# Patient Record
Sex: Male | Born: 1980 | Race: Black or African American | Hispanic: No | Marital: Married | Smoking: Current every day smoker
Health system: Southern US, Community
[De-identification: ages and names within clinical notes are randomized; demographics above are authoritative.]

## PROBLEM LIST (undated history)

## (undated) ENCOUNTER — Emergency Department (HOSPITAL_BASED_OUTPATIENT_CLINIC_OR_DEPARTMENT_OTHER): Payer: BC Managed Care – PPO

## (undated) DIAGNOSIS — G43909 Migraine, unspecified, not intractable, without status migrainosus: Secondary | ICD-10-CM

## (undated) HISTORY — PX: DENTAL SURGERY: SHX609

## (undated) HISTORY — PX: FOOT SURGERY: SHX648

---

## 2000-03-10 ENCOUNTER — Encounter: Payer: Self-pay | Admitting: Emergency Medicine

## 2000-03-10 ENCOUNTER — Emergency Department (HOSPITAL_COMMUNITY): Admission: EM | Admit: 2000-03-10 | Discharge: 2000-03-11 | Payer: Self-pay | Admitting: Emergency Medicine

## 2000-03-11 ENCOUNTER — Encounter: Payer: Self-pay | Admitting: Emergency Medicine

## 2003-11-29 ENCOUNTER — Emergency Department (HOSPITAL_COMMUNITY): Admission: EM | Admit: 2003-11-29 | Discharge: 2003-11-30 | Payer: Self-pay | Admitting: Emergency Medicine

## 2004-07-10 ENCOUNTER — Emergency Department (HOSPITAL_COMMUNITY): Admission: EM | Admit: 2004-07-10 | Discharge: 2004-07-10 | Payer: Self-pay | Admitting: Emergency Medicine

## 2006-08-02 ENCOUNTER — Emergency Department (HOSPITAL_COMMUNITY): Admission: EM | Admit: 2006-08-02 | Discharge: 2006-08-02 | Payer: Self-pay | Admitting: Emergency Medicine

## 2006-08-19 ENCOUNTER — Emergency Department (HOSPITAL_COMMUNITY): Admission: EM | Admit: 2006-08-19 | Discharge: 2006-08-19 | Payer: Self-pay | Admitting: Emergency Medicine

## 2009-06-06 ENCOUNTER — Emergency Department (HOSPITAL_COMMUNITY): Admission: EM | Admit: 2009-06-06 | Discharge: 2009-06-06 | Payer: Self-pay | Admitting: Emergency Medicine

## 2009-07-22 ENCOUNTER — Emergency Department (HOSPITAL_COMMUNITY): Admission: EM | Admit: 2009-07-22 | Discharge: 2009-07-22 | Payer: Self-pay | Admitting: Emergency Medicine

## 2010-09-16 ENCOUNTER — Emergency Department (HOSPITAL_COMMUNITY)
Admission: EM | Admit: 2010-09-16 | Discharge: 2010-09-16 | Disposition: A | Discharge status: Home / Self Care | Payer: Self-pay | Attending: Emergency Medicine | Admitting: Emergency Medicine

## 2010-09-16 DIAGNOSIS — G44009 Cluster headache syndrome, unspecified, not intractable: Secondary | ICD-10-CM | POA: Insufficient documentation

## 2010-09-16 DIAGNOSIS — F17200 Nicotine dependence, unspecified, uncomplicated: Secondary | ICD-10-CM | POA: Insufficient documentation

## 2010-09-16 DIAGNOSIS — J3489 Other specified disorders of nose and nasal sinuses: Secondary | ICD-10-CM | POA: Insufficient documentation

## 2010-09-16 LAB — URINALYSIS, ROUTINE W REFLEX MICROSCOPIC
Bilirubin Urine: NEGATIVE
Glucose, UA: NEGATIVE mg/dL
Hgb urine dipstick: NEGATIVE
Ketones, ur: NEGATIVE mg/dL
Nitrite: NEGATIVE
Protein, ur: NEGATIVE mg/dL
Specific Gravity, Urine: 1.019 (ref 1.005–1.030)
Urobilinogen, UA: 1 mg/dL (ref 0.0–1.0)
pH: 6 (ref 5.0–8.0)

## 2010-09-16 LAB — URINE MICROSCOPIC-ADD ON

## 2011-09-01 ENCOUNTER — Emergency Department (HOSPITAL_COMMUNITY)
Admission: EM | Admit: 2011-09-01 | Discharge: 2011-09-01 | Disposition: A | Discharge status: Home / Self Care | Payer: Self-pay | Attending: Emergency Medicine | Admitting: Emergency Medicine

## 2011-09-01 ENCOUNTER — Encounter (HOSPITAL_COMMUNITY): Payer: Self-pay | Admitting: *Deleted

## 2011-09-01 ENCOUNTER — Other Ambulatory Visit (HOSPITAL_COMMUNITY): Payer: Self-pay | Admitting: Pharmacy Technician

## 2011-09-01 DIAGNOSIS — J069 Acute upper respiratory infection, unspecified: Secondary | ICD-10-CM | POA: Insufficient documentation

## 2011-09-01 DIAGNOSIS — J329 Chronic sinusitis, unspecified: Secondary | ICD-10-CM

## 2011-09-01 DIAGNOSIS — R110 Nausea: Secondary | ICD-10-CM | POA: Insufficient documentation

## 2011-09-01 MED ORDER — AZITHROMYCIN 250 MG PO TABS: 250.0000 mg | ORAL_TABLET | Freq: Every day | ORAL | Status: AC

## 2011-09-01 MED ORDER — OXYMETAZOLINE HCL 0.05 % NA SOLN
1.0000 | Freq: Once | NASAL | Status: AC
  Administered 2011-09-01: 1 via NASAL
  Filled 2011-09-01: qty 15

## 2011-09-01 MED ORDER — ONDANSETRON HCL 4 MG PO TABS: 4.0000 mg | ORAL_TABLET | Freq: Four times a day (QID) | ORAL | Status: AC

## 2011-09-01 NOTE — Discharge Instructions (Signed)
RECOMMEND AFRIN USE AS DIRECTED BUT FOR NO LONGER THAN 2-3 DAYS OR A REBOUND CONGESTION WILL OCCUR, MAKING SYMPTOMS WORSE. YOU CAN USE PLAIN SALINE NASAL SPRAY FOR SYMPTOMATIC RELIEF OF SINUS PRESSURE. CONTINUE TYLENOL OR ADVIL COLD AND SINUS AND TAKE ZITHROMAX AS DIRECTED. PUSH FLUIDS. ZOFRAN FOR NAUSEA IF NEEDED. (YOUR PRESCRIPTIONS HAVE BEEN E-PRESCRIBED AND SHOULD BE READY FOR PICK UP AT YOUR PHARMACY).  Sinusitis Sinuses are air pockets within the bones of your face. The growth of bacteria within a sinus leads to infection. The infection prevents the sinuses from draining. This infection is called sinusitis. SYMPTOMS  There will be different areas of pain depending on which sinuses have become infected.  The maxillary sinuses often produce pain beneath the eyes.   Frontal sinusitis may cause pain in the middle of the forehead and above the eyes.  Other problems (symptoms) include:  Toothaches.   Colored, pus-like (purulent) drainage from the nose.   Swelling, warmth, and tenderness over the sinus areas may be signs of infection.  TREATMENT  Sinusitis is most often determined by an exam.X-rays may be taken. If x-rays have been taken, make sure you obtain your results or find out how you are to obtain them. Your caregiver may give you medications (antibiotics). These are medications that will help kill the bacteria causing the infection. You may also be given a medication (decongestant) that helps to reduce sinus swelling.  HOME CARE INSTRUCTIONS   Only take over-the-counter or prescription medicines for pain, discomfort, or fever as directed by your caregiver.   Drink extra fluids. Fluids help thin the mucus so your sinuses can drain more easily.   Applying either moist heat or ice packs to the sinus areas may help relieve discomfort.   Use saline nasal sprays to help moisten your sinuses. The sprays can be found at your local drugstore.  SEEK IMMEDIATE MEDICAL CARE IF:  You have a  fever.   You have increasing pain, severe headaches, or toothache.   You have nausea, vomiting, or drowsiness.   You develop unusual swelling around the face or trouble seeing.  MAKE SURE YOU:   Understand these instructions.   Will watch your condition.   Will get help right away if you are not doing well or get worse.  Document Released: 06/02/2005 Document Revised: 05/22/2011 Document Reviewed: 12/30/2006 [facility] Patient Information 2012 [place], [place].

## 2011-09-01 NOTE — ED Notes (Addendum)
Pt in c/o cough, congestion, and nausea x2 weeks, c/o facial pain and pressure

## 2011-09-01 NOTE — ED Provider Notes (Signed)
History     [name]: [id number]  Arrival date & time 09/01/11  1334   First MD Initiated Contact with Patient 09/01/11 1522      Chief Complaint  Patient presents with  . URI  . Nausea    (Consider location/radiation/quality/duration/timing/severity/associated sxs/prior treatment) Patient is a 31 y.o. male presenting with URI and vomiting. The history is provided by the patient.  URI The primary symptoms include headaches, sore throat, cough, nausea and vomiting. Primary symptoms do not include fever, wheezing, abdominal pain or rash. The current episode started more than 1 week ago.  The headache is not associated with neck stiffness.  Symptoms associated with the illness include sinus pressure, congestion and rhinorrhea. The illness is not associated with chills. Associated symptoms comments: Complains of sinus pressure, nasal congestion, cough for one month. Job requires going in and out of doors, cold to warm. He feels this is contributing to symptoms. No know fever..  Emesis  This is a new problem. The current episode started 2 days ago. The problem has not changed since onset.There has been no fever. Associated symptoms include cough, headaches and URI. Pertinent negatives include no abdominal pain, no chills and no fever. Associated symptoms comments: He reports his diet has consisted of chicken wings for the past 2 weeks only. He complains of nausea with diarrhea after eating. No abdominal pain. No bloody emesis or diarrhea..    History reviewed. No pertinent past medical history.  History reviewed. No pertinent past surgical history.  History reviewed. No pertinent family history.  History  Substance Use Topics  . Smoking status: Not on file  . Smokeless tobacco: Not on file  . Alcohol Use: Not on file      Review of Systems  Constitutional: Negative for fever and chills.  HENT: Positive for congestion, sore throat, rhinorrhea and sinus pressure. Negative for neck  stiffness.   Respiratory: Positive for cough. Negative for shortness of breath and wheezing.   Cardiovascular: Negative.  Negative for chest pain.  Gastrointestinal: Positive for nausea and vomiting. Negative for abdominal pain.  Musculoskeletal: Negative.   Skin: Negative.  Negative for rash.  Neurological: Positive for headaches.    Allergies  Review of patient's allergies indicates no known allergies.  Home Medications  No current outpatient prescriptions on file.  BP 126/64  Pulse 80  Temp 99 F (37.2 C)  Resp 20  SpO2 98%  Physical Exam  Constitutional: He appears well-developed and well-nourished.  HENT:  Head: Normocephalic.  Right Ear: External ear normal.  Left Ear: External ear normal.  Nose: Mucosal edema present. Right sinus exhibits frontal sinus tenderness. Left sinus exhibits frontal sinus tenderness.  Mouth/Throat: Oropharynx is clear and moist.  Eyes: Conjunctivae are normal. Pupils are equal, round, and reactive to light.  Neck: Normal range of motion. Neck supple.  Cardiovascular: Normal rate and normal heart sounds.   No murmur heard. Pulmonary/Chest: Effort normal and breath sounds normal. He has no wheezes. He has no rales.  Abdominal: Soft. Bowel sounds are normal. He exhibits no distension. There is no tenderness.  Musculoskeletal: Normal range of motion.  Lymphadenopathy:    He has no cervical adenopathy.  Skin: Skin is warm and dry. No pallor.    ED Course  Procedures (including critical care time)  Labs Reviewed - No data to display No results found.   No diagnosis found.    MDM          Rodena Medin, PA-C 09/01/11 1540

## 2011-09-02 NOTE — ED Provider Notes (Signed)
Medical screening examination/treatment/procedure(s) were performed by non-physician practitioner and as supervising physician I was immediately available for consultation/collaboration. Vikram Tillett, MD, FACEP   Shamecka Hocutt L Leane Loring, MD 09/02/11 0053 

## 2012-03-11 ENCOUNTER — Emergency Department (HOSPITAL_COMMUNITY)
Admission: EM | Admit: 2012-03-11 | Discharge: 2012-03-12 | Disposition: A | Discharge status: Home / Self Care | Payer: Self-pay | Attending: Emergency Medicine | Admitting: Emergency Medicine

## 2012-03-11 ENCOUNTER — Encounter (HOSPITAL_COMMUNITY): Payer: Self-pay | Admitting: Emergency Medicine

## 2012-03-11 DIAGNOSIS — F17200 Nicotine dependence, unspecified, uncomplicated: Secondary | ICD-10-CM | POA: Insufficient documentation

## 2012-03-11 DIAGNOSIS — R51 Headache: Secondary | ICD-10-CM | POA: Insufficient documentation

## 2012-03-11 HISTORY — DX: Migraine, unspecified, not intractable, without status migrainosus: G43.909

## 2012-03-11 MED ORDER — METOCLOPRAMIDE HCL 5 MG/ML IJ SOLN
10.0000 mg | Freq: Once | INTRAMUSCULAR | Status: AC
  Administered 2012-03-11: 10 mg via INTRAVENOUS
  Filled 2012-03-11: qty 2

## 2012-03-11 MED ORDER — DIPHENHYDRAMINE HCL 50 MG/ML IJ SOLN
25.0000 mg | Freq: Once | INTRAMUSCULAR | Status: AC
  Administered 2012-03-11: 25 mg via INTRAVENOUS
  Filled 2012-03-11: qty 1

## 2012-03-11 MED ORDER — DEXAMETHASONE SODIUM PHOSPHATE 10 MG/ML IJ SOLN
10.0000 mg | Freq: Once | INTRAMUSCULAR | Status: AC
  Administered 2012-03-11: 10 mg via INTRAVENOUS
  Filled 2012-03-11: qty 1

## 2012-03-11 NOTE — ED Provider Notes (Signed)
History     [name]: [id number]  Arrival date & time 03/11/12  2032   First MD Initiated Contact with Patient 03/11/12 2240      Chief Complaint  Patient presents with  . Headache    (Consider location/radiation/quality/duration/timing/severity/associated sxs/prior treatment) Patient is a 31 y.o. male presenting with headaches. The history is provided by the patient.  Headache  This is a chronic problem. The current episode started more than 1 week ago. The problem occurs constantly. The problem has been gradually worsening. The headache is associated with loud noise and bright light. The pain is located in the left unilateral region. The quality of the pain is described as dull and throbbing. The pain is at a severity of 5/10. The pain does not radiate. Pertinent negatives include no anorexia, no fever, no malaise/fatigue, no chest pressure, no near-syncope, no orthopnea, no palpitations, no syncope, no shortness of breath, no nausea and no vomiting. He has tried acetaminophen, aspirin and NSAIDs for the symptoms. The treatment provided mild relief.   Multiple years hx of L sided throbbing HA. Worse over last couple days. 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  Denies trauma, numbness, visual disturbance, weakness, fevers, nausea, or vomiting. Not worse HA of his life.   Past Medical History  Diagnosis Date  . Migraine     Past Surgical History  Procedure Date  . Dental surgery     History reviewed. No pertinent family history.  History  Substance Use Topics  . Smoking status: Current Every Day Smoker -- 0.2 packs/day    Types: Cigarettes  . Smokeless tobacco: Not on file  . Alcohol Use: Yes     socially      Review of Systems  Constitutional: Negative for fever, malaise/fatigue, activity change and appetite change.  HENT: Negative for ear pain, congestion, sore throat, rhinorrhea, neck pain, neck stiffness and sinus pressure.   Eyes: Negative for pain and redness.    Respiratory: Negative for cough, chest tightness and shortness of breath.   Cardiovascular: Negative for chest pain, palpitations, orthopnea, syncope and near-syncope.  Gastrointestinal: Negative for nausea, vomiting, abdominal pain, diarrhea, abdominal distention and anorexia.  Genitourinary: Negative for dysuria, flank pain and difficulty urinating.  Musculoskeletal: Negative for back pain.  Skin: Negative for rash and wound.  Neurological: Positive for headaches. Negative for dizziness, light-headedness and numbness.  Hematological: Negative for adenopathy.  Psychiatric/Behavioral: Negative for behavioral problems, confusion and agitation.    Allergies  Review of patient's allergies indicates no known allergies.  Home Medications   Current Outpatient Rx  Name Route Sig Dispense Refill  . ACETAMINOPHEN 500 MG PO TABS Oral Take 500 mg by mouth every 6 (six) hours as needed.    . BC HEADACHE POWDER PO Oral Take 1 packet by mouth daily as needed. For pain per patient    . IBUPROFEN 200 MG PO TABS Oral Take 200 mg by mouth every 6 (six) hours as needed. For pain      BP 127/72  Pulse 70  Temp 98.4 F (36.9 C) (Oral)  Resp 18  SpO2 99%  Physical Exam  Constitutional: He is oriented to person, place, and time. He appears well-developed and well-nourished. No distress.  HENT:  Head: Normocephalic and atraumatic.  Nose: Nose normal.  Mouth/Throat: Oropharynx is clear and moist.  Eyes: Conjunctivae normal and EOM are normal. Pupils are equal, round, and reactive to light.  Neck: Normal range of motion. Neck supple. No tracheal deviation present.  Cardiovascular: Normal rate, regular rhythm, normal heart sounds and intact distal pulses.   Pulmonary/Chest: Effort normal and breath sounds normal. No respiratory distress. He has no rales.  Abdominal: Soft. Bowel sounds are normal. He exhibits no distension. There is no tenderness. There is no rebound and no guarding.  Musculoskeletal:  Normal range of motion. He exhibits no edema and no tenderness.  Neurological: He is alert and oriented to person, place, and time. He has normal strength. No cranial nerve deficit or sensory deficit. He displays a negative Romberg sign. GCS eye subscore is 4. GCS verbal subscore is 5. GCS motor subscore is 6.  Reflex Scores:      Tricep reflexes are 2+ on the right side and 2+ on the left side.      Patellar reflexes are 2+ on the right side and 2+ on the left side.      Achilles reflexes are 2+ on the right side and 2+ on the left side. Skin: Skin is warm and dry.  Psychiatric: He has a normal mood and affect. His behavior is normal.    ED Course  Procedures (including critical care time)  Labs Reviewed - No data to display No results found.   1. Headache       MDM  31 yo male in in no acute distress, afebrile, vital signs stable, non toxic appearing who presents with HA. Neuro exam non focal. Fundoscopic exam unremarkable. No fever , no meningismus - doubt meningitis. HA completely resolved with migraine cocktail (reglan, decadron, benadryl). Recommend Neurology out patient follow up given long hx of HAs. Thorough discussion with patient and family including return precautions patient expressed understanding.         Nadara Mustard, MD 03/12/12 [phone]

## 2012-03-11 NOTE — ED Notes (Addendum)
Pt reports for 3 weeks, having L side head throbbing intermittently; reports today having headache continuously today--reports gets worse at night; reports light/noise sensitivity; neuro intact; reports hx of migraines--stated that this is a monthly thing

## 2012-03-11 NOTE — ED Provider Notes (Addendum)
31 year old male has been having a le temporoparietal area. Headaches have not been getting worse nor have they been getting more frequent. Over-the-counter analgesics could give him only slight pain relief. On exam, funduscopic exam is normal. He does have tenderness over the left temporalis muscle and over the left TMJ. Neck is nontender and supple there is no tenderness palpation over the paracervical muscles. Neurologic exam is normal. His headache has aspects of that would be consistent with TMJ syndrome, fracture consistent with a muscle contraction headache. There also components which sound like possible cluster headache. This headache seems less likely given the length of time that this is been present without any abatement. 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 Headache started after having wisdom tooth extraction. He describes a throbbing pain primarily in the left  Dione Booze, MD 03/11/12 2352  I saw and evaluated the patient, reviewed the resident's note and I agree with the findings and plan.  Dione Booze, MD 03/12/12 [phone]

## 2012-03-19 ENCOUNTER — Encounter (HOSPITAL_COMMUNITY): Payer: Self-pay

## 2012-03-19 ENCOUNTER — Emergency Department (HOSPITAL_COMMUNITY)
Admission: EM | Admit: 2012-03-19 | Discharge: 2012-03-19 | Disposition: A | Discharge status: Home / Self Care | Payer: Self-pay | Attending: Emergency Medicine | Admitting: Emergency Medicine

## 2012-03-19 DIAGNOSIS — F17200 Nicotine dependence, unspecified, uncomplicated: Secondary | ICD-10-CM | POA: Insufficient documentation

## 2012-03-19 DIAGNOSIS — G43909 Migraine, unspecified, not intractable, without status migrainosus: Secondary | ICD-10-CM | POA: Insufficient documentation

## 2012-03-19 MED ORDER — DEXAMETHASONE SODIUM PHOSPHATE 10 MG/ML IJ SOLN
10.0000 mg | Freq: Once | INTRAMUSCULAR | Status: AC
  Administered 2012-03-19: 10 mg via INTRAVENOUS
  Filled 2012-03-19: qty 1

## 2012-03-19 MED ORDER — METOCLOPRAMIDE HCL 5 MG/ML IJ SOLN
10.0000 mg | Freq: Once | INTRAMUSCULAR | Status: AC
  Administered 2012-03-19: 10 mg via INTRAVENOUS
  Filled 2012-03-19: qty 2

## 2012-03-19 MED ORDER — DIPHENHYDRAMINE HCL 50 MG/ML IJ SOLN
25.0000 mg | Freq: Once | INTRAMUSCULAR | Status: AC
  Administered 2012-03-19: 25 mg via INTRAVENOUS
  Filled 2012-03-19: qty 1

## 2012-03-19 NOTE — ED Provider Notes (Signed)
History     [name]: [id number]  Arrival date & time 03/19/12  1049   First MD Initiated Contact with Patient 03/19/12 1216      Chief Complaint  Patient presents with  . Migraine    (Consider location/radiation/quality/duration/timing/severity/associated sxs/prior treatment) HPI Comments: Patient with a history of migraine headaches presents today with a chief complaint of migraine headache.  Patient stated that he began having this headache this morning.  He has been having headaches frequently over the past 4 weeks.  His headache today is no different than migraine headaches that he has had in the past.  He states that he is sensitive to light.  Denies blurry vision.  He took Tylenol and Benadryl for the pain prior to arrival with mild relief.  No head injury.    Patient is a 31 y.o. male presenting with migraines. The history is provided by the patient.  Migraine This is a new problem. The current episode started today. The problem occurs constantly. The problem has been unchanged. Associated symptoms include headaches and nausea. Pertinent negatives include no chills, fever, neck pain, numbness, rash or vomiting. He has tried acetaminophen for the symptoms. The treatment provided mild relief.    Past Medical History  Diagnosis Date  . Migraine     Past Surgical History  Procedure Date  . Dental surgery     No family history on file.  History  Substance Use Topics  . Smoking status: Current Every Day Smoker -- 0.2 packs/day    Types: Cigarettes  . Smokeless tobacco: Not on file  . Alcohol Use: Yes     socially      Review of Systems  Constitutional: Negative for fever and chills.  HENT: Negative for neck pain and neck stiffness.   Eyes: Positive for photophobia. Negative for visual disturbance.  Gastrointestinal: Positive for nausea. Negative for vomiting.  Skin: Negative for rash.  Neurological: Positive for headaches. Negative for dizziness, syncope,  light-headedness and numbness.  Psychiatric/Behavioral: Negative for confusion.    Allergies  Review of patient's allergies indicates no known allergies.  Home Medications   Current Outpatient Rx  Name Route Sig Dispense Refill  . ACETAMINOPHEN 500 MG PO TABS Oral Take 500 mg by mouth every 6 (six) hours as needed. Headache pain.    . ASPIRIN PO Oral Take 2 tablets by mouth once.    Marland Kitchen DIPHENHYDRAMINE HCL 25 MG PO CAPS Oral Take 25 mg by mouth every 6 (six) hours as needed. Migraine symptoms.    . IBUPROFEN 200 MG PO TABS Oral Take 200 mg by mouth every 6 (six) hours as needed. For headache  pain      BP 115/66  Pulse 63  Temp 98.4 F (36.9 C) (Oral)  Resp 16  SpO2 100%  Physical Exam  Nursing note and vitals reviewed. Constitutional: He appears well-developed and well-nourished.  HENT:  Head: Normocephalic and atraumatic.  Eyes: EOM are normal. Pupils are equal, round, and reactive to light.  Neck: Normal range of motion. Neck supple.  Cardiovascular: Normal rate, regular rhythm and normal heart sounds.   Pulmonary/Chest: Effort normal and breath sounds normal.  Musculoskeletal: Normal range of motion.  Neurological: He is alert. He has normal strength. No cranial nerve deficit or sensory deficit. Coordination and gait normal.       Normal finger to nose testing Normal rapid alternating movements No ataxia  Skin: Skin is warm and dry. No rash noted.  Psychiatric: He has a normal mood  and affect.    ED Course  Procedures (including critical care time)  Labs Reviewed - No data to display No results found.   No diagnosis found.   1:29 PM Reassessed patient.  He reports that his headache has resolved at this time. MDM  Pt HA treated and improved while in ED.  Presentation is like pts typical HA and non concerning for [facility], [facility], or Meningitis. Pt is afebrile with no focal neuro deficits, nuchal rigidity, or change in vision. Pt is to follow up with PCP to discuss  prophylactic medication. Patient also given [facility].  Pt verbalizes understanding and is agreeable with plan to dc.         Pascal Lux Kaylor, PA-C 03/20/12 [phone]

## 2012-03-19 NOTE — ED Notes (Signed)
Pt here for migraine x 4 weeks on and off. Has been to [facility] ED in the past 4 week for his migraine treatment. Positive photosensitivity, blurry vision, dizziness.

## 2012-03-19 NOTE — ED Notes (Signed)
Pt with hx of migraines having an episode since this am around 0730, which woke him up. The pain is right lateral on frontal region. Pt denies nausea or vomiting. Pt is sensitive to light.

## 2012-03-23 NOTE — ED Provider Notes (Signed)
Medical screening examination/treatment/procedure(s) were performed by non-physician practitioner and as supervising physician I was immediately available for consultation/collaboration.   Rie Mcneil Y. Eschol Auxier, MD 03/23/12 0005 

## 2013-06-10 ENCOUNTER — Encounter (HOSPITAL_COMMUNITY): Payer: Self-pay | Admitting: Emergency Medicine

## 2013-06-10 ENCOUNTER — Emergency Department (HOSPITAL_COMMUNITY): Payer: Self-pay

## 2013-06-10 ENCOUNTER — Other Ambulatory Visit: Payer: Self-pay

## 2013-06-10 DIAGNOSIS — F17200 Nicotine dependence, unspecified, uncomplicated: Secondary | ICD-10-CM | POA: Insufficient documentation

## 2013-06-10 DIAGNOSIS — Z792 Long term (current) use of antibiotics: Secondary | ICD-10-CM | POA: Insufficient documentation

## 2013-06-10 DIAGNOSIS — Z8679 Personal history of other diseases of the circulatory system: Secondary | ICD-10-CM | POA: Insufficient documentation

## 2013-06-10 DIAGNOSIS — J159 Unspecified bacterial pneumonia: Secondary | ICD-10-CM | POA: Insufficient documentation

## 2013-06-10 DIAGNOSIS — Z791 Long term (current) use of non-steroidal anti-inflammatories (NSAID): Secondary | ICD-10-CM | POA: Insufficient documentation

## 2013-06-10 LAB — CBC
HCT: 39.7 % (ref 39.0–52.0)
Hemoglobin: 14.1 g/dL (ref 13.0–17.0)
MCH: 29.4 pg (ref 26.0–34.0)
MCHC: 35.5 g/dL (ref 30.0–36.0)
MCV: 82.7 fL (ref 78.0–100.0)
Platelets: 191 10*3/uL (ref 150–400)
RBC: 4.8 MIL/uL (ref 4.22–5.81)
RDW: 14.8 % (ref 11.5–15.5)
WBC: 5.3 10*3/uL (ref 4.0–10.5)

## 2013-06-10 LAB — BASIC METABOLIC PANEL
BUN: 9 mg/dL (ref 6–23)
CO2: 29 mEq/L (ref 19–32)
Calcium: 9.3 mg/dL (ref 8.4–10.5)
Chloride: 102 mEq/L (ref 96–112)
Creatinine, Ser: 1.11 mg/dL (ref 0.50–1.35)
GFR calc Af Amer: >90 mL/min (ref >90)
GFR calc non Af Amer: 86 mL/min — ABNORMAL LOW (ref >90)
Glucose, Bld: 84 mg/dL (ref 70–99)
Potassium: 3.8 mEq/L (ref 3.5–5.1)
Sodium: 140 mEq/L (ref 135–145)

## 2013-06-10 LAB — POCT I-STAT TROPONIN I: Troponin i, poc: 0.02 ng/mL (ref 0.00–0.08)

## 2013-06-10 LAB — PRO B NATRIURETIC PEPTIDE: Pro B Natriuretic peptide (BNP): 24.7 pg/mL (ref 0–125)

## 2013-06-10 NOTE — ED Notes (Signed)
Pt. reports intermittent left lower chest pain with slight SOB , productive cough and diaphoresis onset this evening .

## 2013-06-11 ENCOUNTER — Emergency Department (HOSPITAL_COMMUNITY)
Admission: EM | Admit: 2013-06-11 | Discharge: 2013-06-11 | Disposition: A | Discharge status: Home / Self Care | Payer: Self-pay | Attending: Emergency Medicine | Admitting: Emergency Medicine

## 2013-06-11 ENCOUNTER — Encounter (HOSPITAL_COMMUNITY): Payer: Self-pay | Admitting: Emergency Medicine

## 2013-06-11 DIAGNOSIS — J189 Pneumonia, unspecified organism: Secondary | ICD-10-CM

## 2013-06-11 DIAGNOSIS — R079 Chest pain, unspecified: Secondary | ICD-10-CM

## 2013-06-11 MED ORDER — AZITHROMYCIN 250 MG PO TABS: 250.0000 mg | ORAL_TABLET | Freq: Every day | ORAL | Status: DC

## 2013-06-11 MED ORDER — NAPROXEN 500 MG PO TABS: 500.0000 mg | ORAL_TABLET | Freq: Two times a day (BID) | ORAL | Status: DC

## 2013-06-11 NOTE — ED Provider Notes (Signed)
[name]: [id number]     Arrival date & time 06/10/13  2047 History   First MD Initiated Contact with Patient 06/11/13 0346     Chief Complaint  Patient presents with  . Chest Pain   (Consider location/radiation/quality/duration/timing/severity/associated sxs/prior Treatment) HPI Comments: 32 year old male, no significant past medical history presents with 2 weeks of increasing cough. This is a productive cough, he has been having chest pain associated with this chest pain is located in the middle of the chest, intermittent, has been coming more prominent over the last week. Initially he noted that it was worse when he was working, lifting heavy items at work, since then it has become more persistent. He does have ongoing cough, the cough and sneezing make the pain worse as well. No swelling in the legs, no history of heart problems, no family history. He does smoke cigarettes.  Patient is a 32 y.o. male presenting with chest pain. The history is provided by the patient.  Chest Pain   Past Medical History  Diagnosis Date  . Migraine    Past Surgical History  Procedure Laterality Date  . Dental surgery     No family history on file. History  Substance Use Topics  . Smoking status: Current Every Day Smoker -- 0.25 packs/day    Types: Cigarettes  . Smokeless tobacco: Not on file  . Alcohol Use: Yes     Comment: socially    Review of Systems  Cardiovascular: Positive for chest pain.  All other systems reviewed and are negative.    Allergies  Review of patient's allergies indicates no known allergies.  Home Medications   Current Outpatient Rx  Name  Route  Sig  Dispense  Refill  . azithromycin (ZITHROMAX Z-PAK) 250 MG tablet   Oral   Take 1 tablet (250 mg total) by mouth daily. 500mg  PO day 1, then 250mg  PO days 205   6 tablet   0   . naproxen (NAPROSYN) 500 MG tablet   Oral   Take 1 tablet (500 mg total) by mouth 2 (two) times daily with a meal.   30 tablet   0    BP  109/71  Pulse 64  Temp(Src) 98.4 F (36.9 C) (Oral)  Resp 18  Wt 240 lb (108.863 kg)  SpO2 99% Physical Exam  Nursing note and vitals reviewed. Constitutional: He appears well-developed and well-nourished. No distress.  HENT:  Head: Normocephalic and atraumatic.  Mouth/Throat: Oropharynx is clear and moist. No oropharyngeal exudate.  Eyes: Conjunctivae and EOM are normal. Pupils are equal, round, and reactive to light. Right eye exhibits no discharge. Left eye exhibits no discharge. No scleral icterus.  Neck: Normal range of motion. Neck supple. No JVD present. No thyromegaly present.  Cardiovascular: Normal rate, regular rhythm, normal heart sounds and intact distal pulses.  Exam reveals no gallop and no friction rub.   No murmur heard. Pulmonary/Chest: Effort normal and breath sounds normal. No respiratory distress. He has no wheezes. He has no rales. He exhibits no tenderness.  Abdominal: Soft. Bowel sounds are normal. He exhibits no distension and no mass. There is no tenderness.  Musculoskeletal: Normal range of motion. He exhibits no edema and no tenderness.  Lymphadenopathy:    He has no cervical adenopathy.  Neurological: He is alert. Coordination normal.  Skin: Skin is warm and dry. No rash noted. No erythema.  Psychiatric: He has a normal mood and affect. His behavior is normal.    ED Course  Procedures (including  critical care time) Labs Review Labs Reviewed  BASIC METABOLIC PANEL - Abnormal; Notable for the following:    GFR calc non Af Amer 86 (*)    All other components within normal limits  CBC  PRO B NATRIURETIC PEPTIDE  POCT I-STAT TROPONIN I   Imaging Review Dg Chest 2 View  06/10/2013   CLINICAL DATA:  Chest pain, cough and congestion. History of smoking.  EXAM: CHEST  2 VIEW  COMPARISON:  None.  FINDINGS: The lungs are well-aerated. Mild apparent focal left basilar opacity may simply reflect overlying osseous structures, though mild pneumonia cannot be  entirely excluded. There is no evidence of pleural effusion or pneumothorax.  The heart is normal in size; the mediastinal contour is within normal limits. No acute osseous abnormalities are seen.  IMPRESSION: Mild apparent focal left basilar opacity may simply reflect overlying osseous structures, though mild pneumonia cannot be entirely excluded.   Electronically Signed   By: Roanna Raider M.D.   On: 06/10/2013 23:14    EKG Interpretation   None     ED ECG REPORT  I personally interpreted this EKG   Date: 06/11/2013   Rate: 79  Rhythm: normal sinus rhythm  QRS Axis: normal  Intervals: normal  ST/T Wave abnormalities: normal  Conduction Disutrbances:none  Narrative Interpretation:   Old EKG Reviewed: none available   MDM   1. Community acquired pneumonia   2. Chest pain    Overall the patient appears well, his EKG is unremarkable, there is no signs of ischemic heart disease, no rubs to suggest pericarditis, lungs appear clear on x-ray except for a small possible infiltrate in the left lung. He'll be treated with Naprosyn and Zithromax and encouraged to followup with the family doctor. He has expressed his understanding to the indications for return and appear stable for discharge.  Filed Vitals:   06/10/13 2056 06/11/13 0259 06/11/13 0332  BP: 145/108 109/71   Pulse: 89 64   Temp: 98.5 F (36.9 C) 97.8 F (36.6 C) 98.4 F (36.9 C)  TempSrc: Oral Oral Oral  Resp: 16 18   Weight: 240 lb (108.863 kg)    SpO2: 98% 100% 99%    Meds given in ED:  Medications - No data to display  New Prescriptions   AZITHROMYCIN (ZITHROMAX Z-PAK) 250 MG TABLET    Take 1 tablet (250 mg total) by mouth daily. 500mg  PO day 1, then 250mg  PO days 205   NAPROXEN (NAPROSYN) 500 MG TABLET    Take 1 tablet (500 mg total) by mouth 2 (two) times daily with a meal.        Vida Roller, MD 06/11/13 [phone]

## 2013-12-20 ENCOUNTER — Emergency Department (HOSPITAL_COMMUNITY): Payer: BC Managed Care – PPO

## 2013-12-20 ENCOUNTER — Encounter (HOSPITAL_COMMUNITY): Payer: Self-pay | Admitting: Emergency Medicine

## 2013-12-20 ENCOUNTER — Emergency Department (HOSPITAL_COMMUNITY)
Admission: EM | Admit: 2013-12-20 | Discharge: 2013-12-20 | Disposition: A | Discharge status: Home / Self Care | Payer: BC Managed Care – PPO | Attending: Emergency Medicine | Admitting: Emergency Medicine

## 2013-12-20 DIAGNOSIS — F17200 Nicotine dependence, unspecified, uncomplicated: Secondary | ICD-10-CM | POA: Insufficient documentation

## 2013-12-20 DIAGNOSIS — R1031 Right lower quadrant pain: Secondary | ICD-10-CM | POA: Insufficient documentation

## 2013-12-20 DIAGNOSIS — Z8679 Personal history of other diseases of the circulatory system: Secondary | ICD-10-CM | POA: Insufficient documentation

## 2013-12-20 DIAGNOSIS — R197 Diarrhea, unspecified: Secondary | ICD-10-CM | POA: Insufficient documentation

## 2013-12-20 DIAGNOSIS — R112 Nausea with vomiting, unspecified: Secondary | ICD-10-CM | POA: Insufficient documentation

## 2013-12-20 LAB — COMPREHENSIVE METABOLIC PANEL
ALT: 9 U/L (ref 0–53)
AST: 14 U/L (ref 0–37)
Albumin: 3.6 g/dL (ref 3.5–5.2)
Alkaline Phosphatase: 42 U/L (ref 39–117)
Anion gap: 11 (ref 5–15)
BUN: 8 mg/dL (ref 6–23)
CO2: 27 mEq/L (ref 19–32)
Calcium: 9.2 mg/dL (ref 8.4–10.5)
Chloride: 103 mEq/L (ref 96–112)
Creatinine, Ser: 1.04 mg/dL (ref 0.50–1.35)
GFR calc Af Amer: >90 mL/min (ref >90)
GFR calc non Af Amer: >90 mL/min (ref >90)
Glucose, Bld: 84 mg/dL (ref 70–99)
Potassium: 3.8 mEq/L (ref 3.7–5.3)
Sodium: 141 mEq/L (ref 137–147)
Total Bilirubin: 0.5 mg/dL (ref 0.3–1.2)
Total Protein: 6.9 g/dL (ref 6.0–8.3)

## 2013-12-20 LAB — CBC WITH DIFFERENTIAL/PLATELET
Basophils Absolute: 0 10*3/uL (ref 0.0–0.1)
Basophils Relative: 0 % (ref 0–1)
Eosinophils Absolute: 0.3 10*3/uL (ref 0.0–0.7)
Eosinophils Relative: 7 % — ABNORMAL HIGH (ref 0–5)
HCT: 37.5 % — ABNORMAL LOW (ref 39.0–52.0)
Hemoglobin: 13.2 g/dL (ref 13.0–17.0)
Lymphocytes Relative: 35 % (ref 12–46)
Lymphs Abs: 1.6 10*3/uL (ref 0.7–4.0)
MCH: 29.2 pg (ref 26.0–34.0)
MCHC: 35.2 g/dL (ref 30.0–36.0)
MCV: 83 fL (ref 78.0–100.0)
Monocytes Absolute: 0.5 10*3/uL (ref 0.1–1.0)
Monocytes Relative: 11 % (ref 3–12)
Neutro Abs: 2.2 10*3/uL (ref 1.7–7.7)
Neutrophils Relative %: 47 % (ref 43–77)
Platelets: 167 10*3/uL (ref 150–400)
RBC: 4.52 MIL/uL (ref 4.22–5.81)
RDW: 14.2 % (ref 11.5–15.5)
WBC: 4.6 10*3/uL (ref 4.0–10.5)

## 2013-12-20 LAB — URINALYSIS, ROUTINE W REFLEX MICROSCOPIC
Bilirubin Urine: NEGATIVE
Glucose, UA: NEGATIVE mg/dL
Hgb urine dipstick: NEGATIVE
Ketones, ur: NEGATIVE mg/dL
Nitrite: NEGATIVE
Protein, ur: NEGATIVE mg/dL
Specific Gravity, Urine: 1.021 (ref 1.005–1.030)
Urobilinogen, UA: 0.2 mg/dL (ref 0.0–1.0)
pH: 5 (ref 5.0–8.0)

## 2013-12-20 LAB — LIPASE, BLOOD: Lipase: 37 U/L (ref 11–59)

## 2013-12-20 LAB — URINE MICROSCOPIC-ADD ON

## 2013-12-20 MED ORDER — IBUPROFEN 800 MG PO TABS: 800.0000 mg | ORAL_TABLET | Freq: Three times a day (TID) | ORAL | Status: DC

## 2013-12-20 MED ORDER — CYCLOBENZAPRINE HCL 10 MG PO TABS: 10.0000 mg | ORAL_TABLET | Freq: Two times a day (BID) | ORAL | Status: DC | PRN

## 2013-12-20 MED ORDER — IOHEXOL 300 MG/ML  SOLN
25.0000 mL | Freq: Once | INTRAMUSCULAR | Status: AC | PRN
  Administered 2013-12-20: 25 mL via ORAL

## 2013-12-20 MED ORDER — KETOROLAC TROMETHAMINE 30 MG/ML IJ SOLN
30.0000 mg | Freq: Once | INTRAMUSCULAR | Status: AC
  Administered 2013-12-20: 30 mg via INTRAVENOUS
  Filled 2013-12-20: qty 1

## 2013-12-20 MED ORDER — IOHEXOL 300 MG/ML  SOLN
80.0000 mL | Freq: Once | INTRAMUSCULAR | Status: AC | PRN
  Administered 2013-12-20: 80 mL via INTRAVENOUS

## 2013-12-20 NOTE — ED Provider Notes (Signed)
Medical screening examination/treatment/procedure(s) were performed by non-physician practitioner and as supervising physician I was immediately available for consultation/collaboration.     Veryl Speak, MD 12/20/13 [phone]

## 2013-12-20 NOTE — ED Notes (Signed)
CT made aware pt finished drinking PO contrast. 

## 2013-12-20 NOTE — ED Notes (Signed)
Pt states his abd started hurting him this morning and has vomited 2x since 0230. He states he had a sharp pain in his right hip a day or two ago while playing basketball and his leg gave out. States he had a little bit of diarrhea this morning. Denies any urinary sx.

## 2013-12-20 NOTE — Discharge Instructions (Signed)

## 2013-12-20 NOTE — ED Notes (Signed)
Nehemiah Settle, PA at the bedside.

## 2013-12-20 NOTE — ED Provider Notes (Signed)
[name]: [id number]     Arrival date & time 12/20/13  4174 History   First MD Initiated Contact with Patient 12/20/13 [phone]     Chief Complaint  Patient presents with  . Abdominal Pain     (Consider location/radiation/quality/duration/timing/severity/associated sxs/prior Treatment) Patient is a 33 y.o. male presenting with abdominal pain. The history is provided by the patient. No language interpreter was used.  Abdominal Pain Pain location:  RUQ and RLQ Pain quality: aching   Pain radiates to:  Does not radiate Pain severity:  Moderate Duration:  8 hours Timing:  Constant Associated symptoms: diarrhea, nausea and vomiting   Associated symptoms: no chest pain, no chills, no dysuria, no fever and no shortness of breath     Past Medical History  Diagnosis Date  . Migraine    Past Surgical History  Procedure Laterality Date  . Dental surgery     No family history on file. History  Substance Use Topics  . Smoking status: Current Every Day Smoker -- 0.25 packs/day    Types: Cigarettes  . Smokeless tobacco: Not on file  . Alcohol Use: Yes     Comment: socially    Review of Systems  Constitutional: Negative for fever and chills.  Respiratory: Negative.  Negative for shortness of breath.   Cardiovascular: Negative.  Negative for chest pain.  Gastrointestinal: Positive for nausea, vomiting, abdominal pain and diarrhea.  Genitourinary: Negative for dysuria, scrotal swelling and testicular pain.  Musculoskeletal: Negative.   Skin: Negative.   Neurological: Negative.       Allergies  Review of patient's allergies indicates no known allergies.  Home Medications   Prior to Admission medications   Not on File   BP 125/78  Pulse 60  Temp(Src) 98 F (36.7 C) (Oral)  Resp 18  SpO2 100% Physical Exam  Constitutional: He is oriented to person, place, and time. He appears well-developed and well-nourished.  HENT:  Head: Normocephalic.  Neck: Normal range of motion. Neck  supple.  Cardiovascular: Normal rate and regular rhythm.   Pulmonary/Chest: Effort normal and breath sounds normal.  Abdominal: Soft. He exhibits no mass. There is tenderness. There is no rebound and no guarding.  RLQ pain with questionably mild rebounding. Minimal RUQ discomfort to palpation. Soft abdomen, nondistended.   Musculoskeletal: Normal range of motion.  Neurological: He is alert and oriented to person, place, and time.  Skin: Skin is warm and dry. No rash noted.  Psychiatric: He has a normal mood and affect.    ED Course  Procedures (including critical care time) Labs Review Labs Reviewed - No data to display  Imaging Review No results found.   EKG Interpretation None     Results for orders placed during the hospital encounter of 12/20/13  CBC WITH DIFFERENTIAL      Result Value Ref Range   WBC 4.6  4.0 - 10.5 K/uL   RBC 4.52  4.22 - 5.81 MIL/uL   Hemoglobin 13.2  13.0 - 17.0 g/dL   HCT 37.5 (*) 39.0 - 52.0 %   MCV 83.0  78.0 - 100.0 fL   MCH 29.2  26.0 - 34.0 pg   MCHC 35.2  30.0 - 36.0 g/dL   RDW 14.2  11.5 - 15.5 %   Platelets 167  150 - 400 K/uL   Neutrophils Relative % 47  43 - 77 %   Neutro Abs 2.2  1.7 - 7.7 K/uL   Lymphocytes Relative 35  12 - 46 %  Lymphs Abs 1.6  0.7 - 4.0 K/uL   Monocytes Relative 11  3 - 12 %   Monocytes Absolute 0.5  0.1 - 1.0 K/uL   Eosinophils Relative 7 (*) 0 - 5 %   Eosinophils Absolute 0.3  0.0 - 0.7 K/uL   Basophils Relative 0  0 - 1 %   Basophils Absolute 0.0  0.0 - 0.1 K/uL  COMPREHENSIVE METABOLIC PANEL      Result Value Ref Range   Sodium 141  137 - 147 mEq/L   Potassium 3.8  3.7 - 5.3 mEq/L   Chloride 103  96 - 112 mEq/L   CO2 27  19 - 32 mEq/L   Glucose, Bld 84  70 - 99 mg/dL   BUN 8  6 - 23 mg/dL   Creatinine, Ser 1.04  0.50 - 1.35 mg/dL   Calcium 9.2  8.4 - 10.5 mg/dL   Total Protein 6.9  6.0 - 8.3 g/dL   Albumin 3.6  3.5 - 5.2 g/dL   AST 14  0 - 37 U/L   ALT 9  0 - 53 U/L   Alkaline Phosphatase 42  39  - 117 U/L   Total Bilirubin 0.5  0.3 - 1.2 mg/dL   GFR calc non Af Amer >90  >90 mL/min   GFR calc Af Amer >90  >90 mL/min   Anion gap 11  5 - 15  LIPASE, BLOOD      Result Value Ref Range   Lipase 37  11 - 59 U/L  URINALYSIS, ROUTINE W REFLEX MICROSCOPIC      Result Value Ref Range   Color, Urine YELLOW  YELLOW   APPearance CLEAR  CLEAR   Specific Gravity, Urine 1.021  1.005 - 1.030   pH 5.0  5.0 - 8.0   Glucose, UA NEGATIVE  NEGATIVE mg/dL   Hgb urine dipstick NEGATIVE  NEGATIVE   Bilirubin Urine NEGATIVE  NEGATIVE   Ketones, ur NEGATIVE  NEGATIVE mg/dL   Protein, ur NEGATIVE  NEGATIVE mg/dL   Urobilinogen, UA 0.2  0.0 - 1.0 mg/dL   Nitrite NEGATIVE  NEGATIVE   Leukocytes, UA MODERATE (*) NEGATIVE  URINE MICROSCOPIC-ADD ON      Result Value Ref Range   Squamous Epithelial / LPF RARE  RARE   WBC, UA 7-10  <3 WBC/hpf   RBC / HPF 0-2  <3 RBC/hpf   Ct Abdomen Pelvis W Contrast  12/20/2013   CLINICAL DATA:  Abdominal pain, vomiting, diarrhea  EXAM: CT ABDOMEN AND PELVIS WITH CONTRAST  TECHNIQUE: Multidetector CT imaging of the abdomen and pelvis was performed using the standard protocol following bolus administration of intravenous contrast.  CONTRAST:  58mL OMNIPAQUE IOHEXOL 300 MG/ML  SOLN  COMPARISON:  03/11/2000 CT report only  FINDINGS: Lower chest: Clear lung bases. Normal heart size. No pericardial or pleural effusion.  Abdomen: Liver, gallbladder, biliary system, pancreas, spleen, adrenal glands, and kidneys are within normal limits for age and demonstrate no acute process.  No abdominal free fluid, fluid collection, hemorrhage, abscess, or adenopathy.  Intact aorta. Negative for aneurysm. No retroperitoneal abnormality.  Negative for bowel obstruction, dilatation, ileus, or free air.  Normal appendix in the right lower quadrant.  Pelvis: No pelvic free fluid, fluid collection, hemorrhage, abscess, adenopathy, inguinal abnormality, or hernia. Urinary bladder unremarkable. No acute  distal bowel process.  No acute or abnormal osseous finding.  IMPRESSION: No acute intra-abdominal or pelvic finding by CT.   Electronically Signed   By:  Daryll Brod M.D.   On: 12/20/2013 11:41   MDM   Final diagnoses:  None    1. Abdominal pain  Labs/CT negative. No fever. Doubt acute intra-abdominal process, appendix ruled out. Stable for discharge home.     Dewaine Oats, PA-C 12/20/13 1212

## 2013-12-21 ENCOUNTER — Encounter (HOSPITAL_COMMUNITY): Payer: Self-pay | Admitting: Emergency Medicine

## 2013-12-21 ENCOUNTER — Emergency Department (HOSPITAL_COMMUNITY)
Admission: EM | Admit: 2013-12-21 | Discharge: 2013-12-22 | Disposition: A | Discharge status: Home / Self Care | Payer: BC Managed Care – PPO | Attending: Emergency Medicine | Admitting: Emergency Medicine

## 2013-12-21 DIAGNOSIS — Y9239 Other specified sports and athletic area as the place of occurrence of the external cause: Secondary | ICD-10-CM | POA: Insufficient documentation

## 2013-12-21 DIAGNOSIS — G43909 Migraine, unspecified, not intractable, without status migrainosus: Secondary | ICD-10-CM | POA: Insufficient documentation

## 2013-12-21 DIAGNOSIS — IMO0002 Reserved for concepts with insufficient information to code with codable children: Secondary | ICD-10-CM | POA: Insufficient documentation

## 2013-12-21 DIAGNOSIS — Y92838 Other recreation area as the place of occurrence of the external cause: Secondary | ICD-10-CM

## 2013-12-21 DIAGNOSIS — K2990 Gastroduodenitis, unspecified, without bleeding: Principal | ICD-10-CM

## 2013-12-21 DIAGNOSIS — S76011A Strain of muscle, fascia and tendon of right hip, initial encounter: Secondary | ICD-10-CM

## 2013-12-21 DIAGNOSIS — Z791 Long term (current) use of non-steroidal anti-inflammatories (NSAID): Secondary | ICD-10-CM | POA: Insufficient documentation

## 2013-12-21 DIAGNOSIS — X500XXA Overexertion from strenuous movement or load, initial encounter: Secondary | ICD-10-CM | POA: Insufficient documentation

## 2013-12-21 DIAGNOSIS — Y9367 Activity, basketball: Secondary | ICD-10-CM | POA: Insufficient documentation

## 2013-12-21 DIAGNOSIS — F17200 Nicotine dependence, unspecified, uncomplicated: Secondary | ICD-10-CM | POA: Insufficient documentation

## 2013-12-21 DIAGNOSIS — K2970 Gastritis, unspecified, without bleeding: Secondary | ICD-10-CM | POA: Insufficient documentation

## 2013-12-21 MED ORDER — ONDANSETRON 4 MG PO TBDP
8.0000 mg | ORAL_TABLET | Freq: Once | ORAL | Status: AC
  Administered 2013-12-21: 8 mg via ORAL
  Filled 2013-12-21: qty 2

## 2013-12-21 MED ORDER — FAMOTIDINE 20 MG PO TABS: 20.0000 mg | ORAL_TABLET | Freq: Two times a day (BID) | ORAL | Status: DC

## 2013-12-21 NOTE — ED Provider Notes (Signed)
[name]: [id number]     Arrival date & time 12/21/13  1939 History   First MD Initiated Contact with Patient 12/21/13 2239     Chief Complaint  Patient presents with  . Abdominal Pain     (Consider location/radiation/quality/duration/timing/severity/associated sxs/prior Treatment) HPI Patient is a generally healthy 33 yo man who presents to the ED for the 2nd time in the past 48 hrs with complaints of intermittent epigastric pain x 2 days. He also has pain in the right hip flexor region which began after he came down in an awkward fashion while playing basketball.   Has been nauseated x 1. No diarrhea. No GU sx. the patient describes his current pain as aching, worse with certain movements of the hip, particularly external rotation. He denies paresthesias and motor weakness.  Epigastric pain is mild and burning. The patient says he thinks he age some bad chicken a couple of days ago.   Of note, work up last night notable for normal labs and CT abd/pelvis. Patietn discharghed with flexeril and ibuprofren.   Past Medical History  Diagnosis Date  . Migraine    Past Surgical History  Procedure Laterality Date  . Dental surgery     No family history on file. History  Substance Use Topics  . Smoking status: Current Every Day Smoker -- 0.25 packs/day    Types: Cigarettes  . Smokeless tobacco: Not on file  . Alcohol Use: Yes     Comment: socially    Review of Systems  Ten point review of symptoms performed and is negative with the exception of symptoms noted above.     Allergies  Review of patient's allergies indicates no known allergies.  Home Medications   Prior to Admission medications   Medication Sig Start Date End Date Taking? Authorizing Provider  cyclobenzaprine (FLEXERIL) 10 MG tablet Take 1 tablet (10 mg total) by mouth 2 (two) times daily as needed for muscle spasms. 12/20/13   Shari A Upstill, PA-C  ibuprofen (ADVIL,MOTRIN) 800 MG tablet Take 1 tablet (800 mg total) by  mouth 3 (three) times daily. 12/20/13   Shari A Upstill, PA-C   BP 117/80  Pulse 62  Temp(Src) 99.2 F (37.3 C) (Oral)  Resp 16  Ht 6\' 3"  (1.905 m)  Wt 204 lb (92.534 kg)  BMI 25.50 kg/m2  SpO2 100% Physical Exam Gen: well developed and well nourished appearing Head: NCAT Eyes: PERL, EOMI Nose: no epistaixis or rhinorrhea Mouth/throat: mucosa is moist and pink Neck: supple, no stridor Lungs: CTA B, no wheezing, rhonchi or rales CV: RRR, no murmur, extremities appear well perfused.  Abd: soft, mildly after over the epigastrium diffusely, no tenderness over the lower abdomen nondistended Back: no ttp, no cva ttp Skin: warm and dry Ext: normal to inspection, ttp over the right hip flexor musculature, pain with external and internal rotation of right hip.  no dependent edema. DP pulses symmetric,  Neuro: CN ii-xii grossly intact, no focal deficits, 5/5 motor strength all major muscle groups bilaterally, sensation intact to light touch throughout.  Psyche; normal affect,  calm and cooperative.   ED Course  Procedures (including critical care time) Labs Review Labs Reviewed - No data to display  Imaging Review Ct Abdomen Pelvis W Contrast  12/20/2013   CLINICAL DATA:  Abdominal pain, vomiting, diarrhea  EXAM: CT ABDOMEN AND PELVIS WITH CONTRAST  TECHNIQUE: Multidetector CT imaging of the abdomen and pelvis was performed using the standard protocol following bolus administration of intravenous contrast.  CONTRAST:  37mL OMNIPAQUE IOHEXOL 300 MG/ML  SOLN  COMPARISON:  03/11/2000 CT report only  FINDINGS: Lower chest: Clear lung bases. Normal heart size. No pericardial or pleural effusion.  Abdomen: Liver, gallbladder, biliary system, pancreas, spleen, adrenal glands, and kidneys are within normal limits for age and demonstrate no acute process.  No abdominal free fluid, fluid collection, hemorrhage, abscess, or adenopathy.  Intact aorta. Negative for aneurysm. No retroperitoneal abnormality.   Negative for bowel obstruction, dilatation, ileus, or free air.  Normal appendix in the right lower quadrant.  Pelvis: No pelvic free fluid, fluid collection, hemorrhage, abscess, adenopathy, inguinal abnormality, or hernia. Urinary bladder unremarkable. No acute distal bowel process.  No acute or abnormal osseous finding.  IMPRESSION: No acute intra-abdominal or pelvic finding by CT.   Electronically Signed   By: Daryll Brod M.D.   On: 12/20/2013 11:41      MDM   Labs from last night are unremarkable. Patient's abdominal exam is reassuring. He has right hip flexor muscle strain. I have recommended limiting activity, rest, no heavy lifting. I have recommended yoga and gentle stretching along with ice and nsaid use. Patient tolerating po intake. Stable for discharge.      Elyn Peers, MD 12/22/13 [phone]

## 2013-12-21 NOTE — Discharge Instructions (Signed)
Gastritis, Adult °Gastritis is soreness and puffiness (inflammation) of the lining of the stomach. If you do not get help, gastritis can cause bleeding and sores (ulcers) in the stomach. °HOME CARE  °· Only take medicine as told by your doctor. °· If you were given antibiotic medicines, take them as told. Finish the medicines even if you start to feel better. °· Drink enough fluids to keep your pee (urine) clear or pale yellow. °· Avoid foods and drinks that make your problems worse. Foods you may want to avoid include: °¨ Caffeine or alcohol. °¨ Chocolate. °¨ Mint. °¨ Garlic and onions. °¨ Spicy foods. °¨ Citrus fruits, including oranges, lemons, or limes. °¨ Food containing tomatoes, including sauce, chili, salsa, and pizza. °¨ Fried and fatty foods. °· Eat small meals throughout the day instead of large meals. °GET HELP RIGHT AWAY IF:  °· You have black or dark red poop (stools). °· You throw up (vomit) blood. It may look like coffee grounds. °· You cannot keep fluids down. °· Your belly (abdominal) pain gets worse. °· You have a fever. °· You do not feel better after 1 week. °· You have any other questions or concerns. °MAKE SURE YOU:  °· Understand these instructions. °· Will watch your condition. °· Will get help right away if you are not doing well or get worse. °Document Released: 11/19/2007 Document Revised: 08/25/2011 Document Reviewed: 07/16/2011 °Exit[employer]® Patient Information ©2015 [employer]. This information is not intended to replace advice given to you by your health care provider. Make sure you discuss any questions you have with your health care provider. ° °

## 2013-12-21 NOTE — ED Notes (Signed)
Pt was seen here yesterday for abdominal pain. Discharge diagnosis was abdominal pain. Pt states that he is back today because his abdomen is still tender, and he is now having sharp pains to his right hip. C/o 10/10 pain when walking in right hip. States that he thinks abdominal pain is "because I ate some chicken that wasn't quite done." Pt main concern at this time is hip pain.

## 2013-12-21 NOTE — ED Notes (Signed)
Pt. reports mid abdominal pain for 2 days / emesis x1 last night , seen here last night for the same complaints - CT scan /urine test and blood tests done discharged home but did not fill his prescriptions.

## 2013-12-22 ENCOUNTER — Emergency Department (HOSPITAL_COMMUNITY)
Admission: EM | Admit: 2013-12-22 | Discharge: 2013-12-22 | Disposition: A | Discharge status: Home / Self Care | Payer: BC Managed Care – PPO | Source: Home / Self Care | Attending: Emergency Medicine | Admitting: Emergency Medicine

## 2013-12-22 ENCOUNTER — Other Ambulatory Visit (HOSPITAL_COMMUNITY)
Admission: RE | Admit: 2013-12-22 | Discharge: 2013-12-22 | Disposition: A | Discharge status: Home / Self Care | Payer: BC Managed Care – PPO | Source: Ambulatory Visit | Attending: Emergency Medicine | Admitting: Emergency Medicine

## 2013-12-22 ENCOUNTER — Encounter (HOSPITAL_COMMUNITY): Payer: Self-pay | Admitting: Emergency Medicine

## 2013-12-22 DIAGNOSIS — M629 Disorder of muscle, unspecified: Secondary | ICD-10-CM

## 2013-12-22 DIAGNOSIS — Z113 Encounter for screening for infections with a predominantly sexual mode of transmission: Secondary | ICD-10-CM | POA: Insufficient documentation

## 2013-12-22 DIAGNOSIS — M7631 Iliotibial band syndrome, right leg: Secondary | ICD-10-CM

## 2013-12-22 DIAGNOSIS — M2420 Disorder of ligament, unspecified site: Secondary | ICD-10-CM

## 2013-12-22 LAB — POCT URINALYSIS DIP (DEVICE)
Bilirubin Urine: NEGATIVE
Glucose, UA: 100 mg/dL — AB
Ketones, ur: NEGATIVE mg/dL
Leukocytes, UA: NEGATIVE
Nitrite: NEGATIVE
Protein, ur: 100 mg/dL — AB
Specific Gravity, Urine: 1.02 (ref 1.005–1.030)
Urobilinogen, UA: 4 mg/dL — ABNORMAL HIGH (ref 0.0–1.0)
pH: 7 (ref 5.0–8.0)

## 2013-12-22 MED ORDER — FAMOTIDINE 20 MG PO TABS: 20.0000 mg | ORAL_TABLET | Freq: Two times a day (BID) | ORAL | Status: DC

## 2013-12-22 NOTE — ED Provider Notes (Signed)
[name]: [id number]     Arrival date & time 12/22/13  1024 History   First MD Initiated Contact with Patient 12/22/13 1122     Chief Complaint  Patient presents with  . Groin Pain   (Consider location/radiation/quality/duration/timing/severity/associated sxs/prior Treatment) HPI Comments: Ronald Jackson reports he was playing basketball about 5 days ago and during game he injured his right hip/thigh/groin area. Has been seen and evaluated x 2 for same at [facility] ER on both 12/20/2013 & 12/21/2013. (Charts reviewed and revealed normal labs and normal CT Ronald/P). Presents to [facility] stating that his symptoms have yet to improve despite using ibuprofen as instructed. No new injury. No GI or GU issues. No fever.   Patient is Ronald Jackson presenting with groin pain. The history is provided by the patient.  Groin Pain This is Ronald new problem.    Past Medical History  Diagnosis Date  . Migraine    Past Surgical History  Procedure Laterality Date  . Dental surgery     History reviewed. No pertinent family history. History  Substance Use Topics  . Smoking status: Current Every Day Smoker -- 0.25 packs/day    Types: Cigarettes  . Smokeless tobacco: Not on file  . Alcohol Use: Yes     Comment: socially    Review of Systems  All other systems reviewed and are negative.   Allergies  Review of patient's allergies indicates no known allergies.  Home Medications   Prior to Admission medications   Medication Sig Start Date End Date Taking? Authorizing Provider  cyclobenzaprine (FLEXERIL) 10 MG tablet Take 1 tablet (10 mg total) by mouth 2 (two) times daily as needed for muscle spasms. 12/20/13   Shari Ronald Upstill, PA-C  famotidine (PEPCID) 20 MG tablet Take 1 tablet (20 mg total) by mouth 2 (two) times daily. 12/22/13   Elyn Peers, MD  ibuprofen (ADVIL,MOTRIN) 800 MG tablet Take 1 tablet (800 mg total) by mouth 3 (three) times daily. 12/20/13   Shari Ronald Upstill, PA-C   BP 121/81  Pulse 73  Temp(Src) 98.4 F  (36.9 C) (Oral)  Resp 18  SpO2 98% Physical Exam  Nursing note and vitals reviewed. Constitutional: He is oriented to person, place, and time. He appears well-developed and well-nourished. No distress.  HENT:  Head: Normocephalic and atraumatic.  Eyes: Conjunctivae are normal. No scleral icterus.  Neck: Normal range of motion. Neck supple.  Cardiovascular: Normal rate, regular rhythm and normal heart sounds.   Pulmonary/Chest: Effort normal and breath sounds normal.  Abdominal: Soft. Normal appearance and bowel sounds are normal. He exhibits no distension and no mass. There is no hepatosplenomegaly. There is no tenderness. There is no rigidity, no rebound, no guarding, no CVA tenderness, no tenderness at McBurney's point and negative Murphy's sign. No hernia. Hernia confirmed negative in the ventral area, confirmed negative in the right inguinal area and confirmed negative in the left inguinal area.  Genitourinary: Right testis shows no swelling and no tenderness. Left testis shows no swelling and no tenderness.  Musculoskeletal:       Right hip: He exhibits tenderness. He exhibits normal range of motion, normal strength, no bony tenderness, no swelling, no crepitus, no deformity and no laceration.       Legs: Outline area is area of discomfort with palpation and ROM (particularily abduction of right leg and external rotation at right hip).  CSM exam of RLE intact.   Lymphadenopathy:       Right: No inguinal  adenopathy present.       Left: No inguinal adenopathy present.  Neurological: He is alert and oriented to person, place, and time.  Skin: Skin is warm and dry. No rash noted. No erythema.  Psychiatric: He has Ronald normal mood and affect. His behavior is normal.    ED Course  Procedures (including critical care time) Labs Review Labs Reviewed  POCT URINALYSIS DIP (DEVICE) - Abnormal; Notable for the following:    Glucose, UA 100 (*)    Hgb urine dipstick TRACE (*)    Protein, ur  100 (*)    Urobilinogen, UA 4.0 (*)    All other components within normal limits  URINE CULTURE  URINE CYTOLOGY ANCILLARY ONLY    Imaging Review No results found.   MDM   1. IT band syndrome, right    Exam consistent with right IT band injury. Urine will be sent for C&S and cytology. I have also generated Ronald referral in EPIC for patient to be contacted by [facility] for follow up evaluation. Will advise his to continue to use ibuprofen or naprosyn as directed on packaging for pain and to discuss modifications of his duties at work with his employer or the [facility].   [place], [place] 12/22/13 1306

## 2013-12-22 NOTE — ED Notes (Addendum)
Reports pulling groin right groin 4 days ago when playing basketball.  Having sharp pain in groin with walking.  States does a lot of lifting/pulling/pushing at work which cause even more pain.  No relief with otc pain meds.

## 2013-12-22 NOTE — Discharge Instructions (Signed)
You have likely strained your right IT (ileotibial) band. I have sent a referral to the [facility]. They should contact your for an appointment. If you have not heard from the office in the next 2-3 days, please call for an appointment. Continue to use ibuprofen as directed for pain. If your urine studies indicate you need additional treatment, you will be contacted by phone.   Iliotibial Band Syndrome Iliotibial band syndrome is pain in the outer, lower thigh. The pain is caused by an inflammation of the iliotibial band. This is a band of thick fibrous tissue that runs down the outside of the thigh. The iliotibial band begins at the hip. It extends to the outer side of the shin bone (tibia) just below the knee joint. The band works with the thigh muscles. Together they provide stability to the outside of the knee joint. Iliotibial band syndrome occurs when there is inflammation to this band of tissue. This is typically due to over use and not due to an injury. The irritation usually occurs over the outside of the knee joint, at the the end of the thigh bone (femur). The iliotibial band crosses bone and muscle at this point. Between these structures is a cushioning sac (bursa). The bursa should make possible a smooth gliding motion. However, when inflamed, the iliotibial band does not glide easily. When inflamed, there is pain with motion of the knee. Usually the pain worsens with continued movement and the pain goes away with rest. This problem usually arises when there is a sudden increase in sports activities involving your legs. Running, and playing soccer or basketball are examples of activities causing this. Others who are prone to iliotibial band syndrome include individuals with mechanical problems such as leg length differences, abnormality of walking, bowed legs etc. HOME CARE INSTRUCTIONS   Apply ice to the injured area:  Put ice in a plastic bag.  Place a towel between your skin  and the bag.  Leave the ice on for 20 minutes, 2-3 times a day.  Limit excessive training or eliminate training until pain goes away.  While pain is present, you may use gentle range of motion. Do not resume regular use until instructed by your health care provider. Begin use gradually. Do not increase activity to the point of pain. If pain does develop, decrease activity and continue the above measures. Gradually increase activities that do not cause discomfort. Do this until you finally achieve normal use.  Perform low-impact activities while pain is present. Wear proper footwear.  Only take over-the-counter or prescription medicines for pain, discomfort, or fever as directed by your health care provider. SEEK MEDICAL CARE IF:   Your pain increases or pain is not controlled with medications.  You develop new, unexplained symptoms, or an increase of the symptoms that brought you to your health care provider.  Your pain and symptoms are not improving or are getting worse. Document Released: 11/22/2001 Document Revised: 03/23/2013 Document Reviewed: 12/30/2012 [facility] Patient Information 2015 [place], [place]. This information is not intended to replace advice given to you by your health care provider. Make sure you discuss any questions you have with your health care provider.

## 2013-12-23 LAB — URINE CULTURE
Colony Count: NO GROWTH
Culture: NO GROWTH

## 2013-12-24 NOTE — ED Provider Notes (Signed)
Medical screening examination/treatment/procedure(s) were performed by non-physician practitioner and as supervising physician I was immediately available for consultation/collaboration.  Philipp Deputy, M.D.  Harden Mo, MD 12/24/13 [phone]

## 2013-12-29 ENCOUNTER — Encounter: Payer: Self-pay | Admitting: Family Medicine

## 2013-12-29 ENCOUNTER — Ambulatory Visit (INDEPENDENT_AMBULATORY_CARE_PROVIDER_SITE_OTHER): Payer: BC Managed Care – PPO | Admitting: Family Medicine

## 2013-12-29 VITALS — BP 111/76 | HR 63 | Ht 75.0 in | Wt 200.0 lb

## 2013-12-29 DIAGNOSIS — M79609 Pain in unspecified limb: Secondary | ICD-10-CM

## 2013-12-29 DIAGNOSIS — M79604 Pain in right leg: Secondary | ICD-10-CM

## 2013-12-29 DIAGNOSIS — S76011A Strain of muscle, fascia and tendon of right hip, initial encounter: Secondary | ICD-10-CM

## 2013-12-29 DIAGNOSIS — IMO0002 Reserved for concepts with insufficient information to code with codable children: Secondary | ICD-10-CM

## 2013-12-29 MED ORDER — HYDROCODONE-ACETAMINOPHEN 5-325 MG PO TABS: 1.0000 | ORAL_TABLET | Freq: Four times a day (QID) | ORAL | Status: DC | PRN

## 2013-12-29 NOTE — Patient Instructions (Signed)
You have hip flexor, proximal quad strains. Ice or heat 15 minutes at a time 3-4 times a day as needed. Aleve 2 tabs twice a day with food OR ibuprofen 600mg  three times a day with food for pain and inflammation. Consider compression shorts. Start physical therapy and transition to just doing home exercises when you feel comfortable doing them on your own. Out of work for 2 weeks - see me then (or in 1-2 weeks if you feel better sooner).

## 2014-01-02 ENCOUNTER — Encounter: Payer: Self-pay | Admitting: Family Medicine

## 2014-01-02 DIAGNOSIS — M79604 Pain in right leg: Secondary | ICD-10-CM | POA: Insufficient documentation

## 2014-01-02 NOTE — Progress Notes (Signed)
Patient ID: Ronald Jackson, male   DOB: 23-Jul-1980, 33 y.o.   MRN: [id number]  PCP: No PCP Per Patient  Subjective:   HPI: Patient is a 33 y.o. male here for right leg pain.  Patient reports he was playing basketball on 7/5. After getting a rebound he landed normally but later getting into a defensive stance and turning felt a sharp pain in anterior right groin area. Stopped playing as a result. Given muscle relaxant by ED - makes him drowsy though. Worse with walking. No bruising or swelling. Worse with abrupt motions too. No prior issues.  Past Medical History  Diagnosis Date  . Migraine     No current outpatient prescriptions on file prior to visit.   No current facility-administered medications on file prior to visit.    Past Surgical History  Procedure Laterality Date  . Dental surgery      No Known Allergies  History   Social History  . Marital Status: Married    Spouse Name: N/A    Number of Children: N/A  . Years of Education: N/A   Occupational History  . Not on file.   Social History Main Topics  . Smoking status: Current Every Day Smoker -- 0.25 packs/day    Types: Cigarettes  . Smokeless tobacco: Not on file  . Alcohol Use: Yes     Comment: socially  . Drug Use: Yes    Special: Marijuana  . Sexual Activity: Not on file     Comment: 2 weeks   Other Topics Concern  . Not on file   Social History Narrative  . No narrative on file    No family history on file.  BP 111/76  Pulse 63  Ht 6\' 3"  (1.905 m)  Wt 200 lb (90.719 kg)  BMI 25.00 kg/m2  Review of Systems: See HPI above.    Objective:  Physical Exam:  Gen: NAD  Right hip: No gross deformity, swelling, bruising, defect. TTP over proximal quad, hip flexor.  No lateral, posterior hip or back tenderness. FROM hip without pain on passive motion. Pain reproduced with straight leg raise, resisted hip flexion, and less with knee extension. Strength 4/5 with hip flexion and straight  leg raise. No pain other motions, 5/5 strength. Sensation intact to light touch. SLR negative.    Assessment & Plan:  1. Right hip pain - 2/2 hip flexor, proximal quad strains.  Ice or heat 15 minutes at a time 3-4 times a day as needed.  Aleve 2 tabs twice a day with food OR ibuprofen 600mg  three times a day with food for pain and inflammation.  Consider compression shorts. Start physical therapy and transition to HEP.  Out of work for 2 weeks - see me then (or in 1-2 weeks if you feel better sooner) for follow up.

## 2014-01-02 NOTE — Assessment & Plan Note (Signed)
2/2 hip flexor, proximal quad strains.  Ice or heat 15 minutes at a time 3-4 times a day as needed.  Aleve 2 tabs twice a day with food OR ibuprofen 600mg  three times a day with food for pain and inflammation.  Consider compression shorts. Start physical therapy and transition to HEP.  Out of work for 2 weeks - see me then (or in 1-2 weeks if you feel better sooner) for follow up.

## 2014-01-12 ENCOUNTER — Ambulatory Visit: Payer: BC Managed Care – PPO | Admitting: Family Medicine

## 2014-01-16 ENCOUNTER — Ambulatory Visit (INDEPENDENT_AMBULATORY_CARE_PROVIDER_SITE_OTHER): Payer: BC Managed Care – PPO | Admitting: Family Medicine

## 2014-01-16 ENCOUNTER — Encounter: Payer: Self-pay | Admitting: Family Medicine

## 2014-01-16 VITALS — BP 117/79 | HR 82 | Ht 75.0 in | Wt 205.0 lb

## 2014-01-16 DIAGNOSIS — M25559 Pain in unspecified hip: Secondary | ICD-10-CM

## 2014-01-16 DIAGNOSIS — M25551 Pain in right hip: Secondary | ICD-10-CM

## 2014-01-16 DIAGNOSIS — M79604 Pain in right leg: Secondary | ICD-10-CM

## 2014-01-16 DIAGNOSIS — M79609 Pain in unspecified limb: Secondary | ICD-10-CM

## 2014-01-18 ENCOUNTER — Encounter: Payer: Self-pay | Admitting: Family Medicine

## 2014-01-18 NOTE — Assessment & Plan Note (Signed)
2/2 hip flexor, proximal quad strains.  He will start physical therapy and HEP.  Ice or heat 15 minutes at a time 3-4 times a day as needed.  Aleve 2 tabs twice a day with food OR ibuprofen 600mg  three times a day with food for pain and inflammation.  Norco for severe pain.  Consider compression shorts.  Work note provided.  F/u in 4 weeks.  Consider radiographs, MRI if not improving as expected.

## 2014-01-18 NOTE — Progress Notes (Signed)
Patient ID: Ronald Jackson, male   DOB: 07/20/1980, 33 y.o.   MRN: [id number]  PCP: No PCP Per Patient  Subjective:   HPI: Patient is a 33 y.o. male here for right leg pain.  7/16: Patient reports he was playing basketball on 7/5. After getting a rebound he landed normally but later getting into a defensive stance and turning felt a sharp pain in anterior right groin area. Stopped playing as a result. Given muscle relaxant by ED - makes him drowsy though. Worse with walking. No bruising or swelling. Worse with abrupt motions too. No prior issues.  8/3: Patient returns and is a little better than last visit. Dull pain in same area of right groin. Interested in doing physical therapy. No catching, locking. No numbness/tingling or back pain.  Past Medical History  Diagnosis Date  . Migraine     Current Outpatient Prescriptions on File Prior to Visit  Medication Sig Dispense Refill  . HYDROcodone-acetaminophen (NORCO) 5-325 MG per tablet Take 1 tablet by mouth every 6 (six) hours as needed for moderate pain.  40 tablet  0   No current facility-administered medications on file prior to visit.    Past Surgical History  Procedure Laterality Date  . Dental surgery      No Known Allergies  History   Social History  . Marital Status: Married    Spouse Name: N/A    Number of Children: N/A  . Years of Education: N/A   Occupational History  . Not on file.   Social History Main Topics  . Smoking status: Current Every Day Smoker -- 0.25 packs/day    Types: Cigarettes  . Smokeless tobacco: Not on file  . Alcohol Use: Yes     Comment: socially  . Drug Use: Yes    Special: Marijuana  . Sexual Activity: Not on file     Comment: 2 weeks   Other Topics Concern  . Not on file   Social History Narrative  . No narrative on file    History reviewed. No pertinent family history.  BP 117/79  Pulse 82  Ht 6\' 3"  (1.905 m)  Wt 205 lb (92.987 kg)  BMI 25.62  kg/m2  Review of Systems: See HPI above.    Objective:  Physical Exam:  Gen: NAD  Right hip: No gross deformity, swelling, bruising, defect. TTP over proximal quad, hip flexor.  No lateral, posterior hip or back tenderness. FROM hip without pain on passive motion. Pain reproduced with straight leg raise, resisted hip flexion, and less with knee extension. Strength 4/5 with hip flexion and straight leg raise. No pain other motions, 5/5 strength. Sensation intact to light touch. SLR negative.    Assessment & Plan:  1. Right hip pain - 2/2 hip flexor, proximal quad strains.  He will start physical therapy and HEP.  Ice or heat 15 minutes at a time 3-4 times a day as needed.  Aleve 2 tabs twice a day with food OR ibuprofen 600mg  three times a day with food for pain and inflammation.  Norco for severe pain.  Consider compression shorts.  Work note provided.  F/u in 4 weeks.  Consider radiographs, MRI if not improving as expected.

## 2014-01-25 ENCOUNTER — Ambulatory Visit (HOSPITAL_BASED_OUTPATIENT_CLINIC_OR_DEPARTMENT_OTHER)
Admission: RE | Admit: 2014-01-25 | Discharge: 2014-01-25 | Disposition: A | Discharge status: Home / Self Care | Payer: BC Managed Care – PPO | Source: Ambulatory Visit | Attending: Family Medicine | Admitting: Family Medicine

## 2014-01-25 ENCOUNTER — Ambulatory Visit (INDEPENDENT_AMBULATORY_CARE_PROVIDER_SITE_OTHER): Payer: BC Managed Care – PPO | Admitting: Family Medicine

## 2014-01-25 ENCOUNTER — Encounter: Payer: Self-pay | Admitting: Family Medicine

## 2014-01-25 VITALS — BP 102/72 | HR 96 | Ht 75.0 in | Wt 205.0 lb

## 2014-01-25 DIAGNOSIS — M25551 Pain in right hip: Secondary | ICD-10-CM

## 2014-01-25 DIAGNOSIS — M25559 Pain in unspecified hip: Secondary | ICD-10-CM | POA: Insufficient documentation

## 2014-01-25 DIAGNOSIS — M79604 Pain in right leg: Secondary | ICD-10-CM

## 2014-01-25 DIAGNOSIS — M79609 Pain in unspecified limb: Secondary | ICD-10-CM | POA: Diagnosis not present

## 2014-01-25 MED ORDER — TRAMADOL HCL 50 MG PO TABS: 50.0000 mg | ORAL_TABLET | Freq: Four times a day (QID) | ORAL | Status: DC | PRN

## 2014-01-25 NOTE — Patient Instructions (Signed)
We will go ahead with an MRI to assess for a stress fracture, hairline fracture. Try tramadol instead. Follow up will depend on the MRI results.

## 2014-01-26 ENCOUNTER — Encounter: Payer: Self-pay | Admitting: Family Medicine

## 2014-01-26 NOTE — Assessment & Plan Note (Signed)
Radiographs negative for fracture, malignancy, other abnormality.  Still suspect hip flexor, proximal quad strains.  However, am concerned about possible stress fracture or hairline fracture which would be worsening with weight bearing.  Will go ahead with MRI of hip to further assess.  Keep future appointment for PT.  Ice or heat 15 minutes at a time 3-4 times a day as needed.  Aleve 2 tabs twice a day with food OR ibuprofen 600mg  three times a day with food for pain and inflammation.  Switch to tramadol for severe pain.

## 2014-01-26 NOTE — Progress Notes (Addendum)
Patient ID: Ronald Jackson, male   DOB: 1980-07-28, 34 y.o.   MRN: [id number]  PCP: No PCP Per Patient  Subjective:   HPI: Patient is a 33 y.o. male here for right leg pain.  7/16: Patient reports he was playing basketball on 7/5. After getting a rebound he landed normally but later getting into a defensive stance and turning felt a sharp pain in anterior right groin area. Stopped playing as a result. Given muscle relaxant by ED - makes him drowsy though. Worse with walking. No bruising or swelling. Worse with abrupt motions too. No prior issues.  8/3: Patient returns and is a little better than last visit. Dull pain in same area of right groin. Interested in doing physical therapy. No catching, locking. No numbness/tingling or back pain.  8/12: Patient returns early due to severity of pain. Up to 6/10 now. Not yet done physical therapy. Taking hydrocodone but puts him to sleep. Ibuprofen also. No catching, locking. Using a crutch which helps - difficulty walking on this leg.  Past Medical History  Diagnosis Date  . Migraine     No current outpatient prescriptions on file prior to visit.   No current facility-administered medications on file prior to visit.    Past Surgical History  Procedure Laterality Date  . Dental surgery      No Known Allergies  History   Social History  . Marital Status: Legally Separated    Spouse Name: N/A    Number of Children: N/A  . Years of Education: N/A   Occupational History  . Not on file.   Social History Main Topics  . Smoking status: Current Every Day Smoker -- 0.25 packs/day    Types: Cigarettes  . Smokeless tobacco: Not on file  . Alcohol Use: Yes     Comment: socially  . Drug Use: Yes    Special: Marijuana  . Sexual Activity: Not on file     Comment: 2 weeks   Other Topics Concern  . Not on file   Social History Narrative  . No narrative on file    History reviewed. No pertinent family history.  BP  102/72  Pulse 96  Ht 6\' 3"  (1.905 m)  Wt 205 lb (92.987 kg)  BMI 25.62 kg/m2  Review of Systems: See HPI above.    Objective:  Physical Exam:  Gen: NAD  Right hip: No gross deformity, swelling, bruising, defect. TTP over proximal quad, hip flexor.  No lateral, posterior hip or back tenderness. FROM hip with mild pain on full IR and ER. Pain reproduced with straight leg raise, resisted hip flexion, and less with knee extension. Strength 4/5 with hip flexion and straight leg raise. No pain other motions, 5/5 strength. Sensation intact to light touch. SLR negative.    Assessment & Plan:  1. Right hip pain - Radiographs negative for fracture, malignancy, other abnormality.  Still suspect hip flexor, proximal quad strains.  However, am concerned about possible stress fracture or hairline fracture which would be worsening with weight bearing.  Will go ahead with MRI of hip to further assess.  Keep future appointment for PT.  Ice or heat 15 minutes at a time 3-4 times a day as needed.  Aleve 2 tabs twice a day with food OR ibuprofen 600mg  three times a day with food for pain and inflammation.  Switch to tramadol for severe pain.    Addendum:  MRI reviewed and discussed with patient.  No fracture, stress fracture, labral  tear, tendon tear or other abnormalities.  Consistent with muscle strains as we had suspected.  Advised to continue with physical therapy - he reports he still hasn't heard from them.  Contacted PT - referral was made twice and each time they called him 3 times (6 total phone calls) and did not hear back from him.  Pushing back his follow-up to 3-4 weeks from now.

## 2014-01-28 ENCOUNTER — Ambulatory Visit (HOSPITAL_BASED_OUTPATIENT_CLINIC_OR_DEPARTMENT_OTHER): Admission: RE | Admit: 2014-01-28 | Payer: BC Managed Care – PPO | Source: Ambulatory Visit

## 2014-02-04 ENCOUNTER — Ambulatory Visit (HOSPITAL_BASED_OUTPATIENT_CLINIC_OR_DEPARTMENT_OTHER)
Admission: RE | Admit: 2014-02-04 | Discharge: 2014-02-04 | Disposition: A | Discharge status: Home / Self Care | Payer: BC Managed Care – PPO | Source: Ambulatory Visit | Attending: Family Medicine | Admitting: Family Medicine

## 2014-02-04 DIAGNOSIS — M8560 Other cyst of bone, unspecified site: Secondary | ICD-10-CM | POA: Insufficient documentation

## 2014-02-04 DIAGNOSIS — M25551 Pain in right hip: Secondary | ICD-10-CM

## 2014-02-04 DIAGNOSIS — M25559 Pain in unspecified hip: Secondary | ICD-10-CM | POA: Diagnosis present

## 2014-02-08 ENCOUNTER — Encounter: Payer: Self-pay | Admitting: Family Medicine

## 2014-02-13 ENCOUNTER — Ambulatory Visit: Payer: BC Managed Care – PPO | Admitting: Family Medicine

## 2014-02-13 ENCOUNTER — Encounter: Payer: Self-pay | Admitting: Family Medicine

## 2014-02-13 ENCOUNTER — Other Ambulatory Visit: Payer: Self-pay | Admitting: Family Medicine

## 2014-02-13 MED ORDER — TRAMADOL HCL 50 MG PO TABS: 50.0000 mg | ORAL_TABLET | Freq: Four times a day (QID) | ORAL | Status: DC | PRN

## 2014-02-13 NOTE — Progress Notes (Signed)
Patient came in today with continued pain.  His first PT appointment isn't until Wednesday.  Advised we don't have anything further to offer other than PT.  Rx tramadol one additional time - nothing stronger.  Tylenol, ibuprofen, capsaicin/aspercreme.  No other indications for testing or orthopedic referral.  Pain localized to hip and MRI negative here.  Push f/u to about 4 weeks from now to allow for PT.  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

## 2014-02-15 ENCOUNTER — Ambulatory Visit: Payer: BC Managed Care – PPO | Attending: Family Medicine | Admitting: Rehabilitation

## 2014-02-15 DIAGNOSIS — IMO0001 Reserved for inherently not codable concepts without codable children: Secondary | ICD-10-CM | POA: Diagnosis not present

## 2014-02-15 DIAGNOSIS — R269 Unspecified abnormalities of gait and mobility: Secondary | ICD-10-CM | POA: Diagnosis not present

## 2014-02-15 DIAGNOSIS — M25559 Pain in unspecified hip: Secondary | ICD-10-CM | POA: Insufficient documentation

## 2014-02-22 ENCOUNTER — Ambulatory Visit: Payer: BC Managed Care – PPO | Admitting: Physical Therapy

## 2014-02-22 DIAGNOSIS — IMO0001 Reserved for inherently not codable concepts without codable children: Secondary | ICD-10-CM | POA: Diagnosis not present

## 2014-02-24 ENCOUNTER — Ambulatory Visit: Payer: BC Managed Care – PPO | Admitting: Rehabilitation

## 2014-02-24 DIAGNOSIS — IMO0001 Reserved for inherently not codable concepts without codable children: Secondary | ICD-10-CM | POA: Diagnosis not present

## 2014-03-02 ENCOUNTER — Encounter: Payer: Self-pay | Admitting: Family Medicine

## 2014-03-02 ENCOUNTER — Ambulatory Visit: Payer: BC Managed Care – PPO | Admitting: Family Medicine

## 2014-03-02 ENCOUNTER — Ambulatory Visit (INDEPENDENT_AMBULATORY_CARE_PROVIDER_SITE_OTHER): Payer: BC Managed Care – PPO | Admitting: Family Medicine

## 2014-03-02 VITALS — BP 114/74 | HR 81 | Ht 75.0 in | Wt 205.0 lb

## 2014-03-02 DIAGNOSIS — M79604 Pain in right leg: Secondary | ICD-10-CM

## 2014-03-02 DIAGNOSIS — M79609 Pain in unspecified limb: Secondary | ICD-10-CM

## 2014-03-02 NOTE — Patient Instructions (Signed)
Continue with physical therapy and home exercises. Return to work on light duty. Follow up with me in 4 weeks.

## 2014-03-06 ENCOUNTER — Encounter: Payer: Self-pay | Admitting: Family Medicine

## 2014-03-06 NOTE — Assessment & Plan Note (Signed)
Radiographs, MRI reassuring.  2/2 hip flexor, proximal quad strains.  Doing well with physical therapy - will continue this and HEP.  Ice or heat 15 minutes at a time 3-4 times a day as needed.  Aleve 2 tabs twice a day with food OR ibuprofen 600mg  three times a day with food for pain and inflammation.  Has tramadol now to take as needed.  Return to light duty.  F/u in 4 weeks.

## 2014-03-06 NOTE — Progress Notes (Signed)
Patient ID: Ronald Jackson, male   DOB: 27-Oct-1980, 33 y.o.   MRN: [id number]  PCP: No PCP Per Patient  Subjective:   HPI: Patient is a 33 y.o. male here for right leg pain.  7/16: Patient reports he was playing basketball on 7/5. After getting a rebound he landed normally but later getting into a defensive stance and turning felt a sharp pain in anterior right groin area. Stopped playing as a result. Given muscle relaxant by ED - makes him drowsy though. Worse with walking. No bruising or swelling. Worse with abrupt motions too. No prior issues.  8/3: Patient returns and is a little better than last visit. Dull pain in same area of right groin. Interested in doing physical therapy. No catching, locking. No numbness/tingling or back pain.  8/12: Patient returns early due to severity of pain. Up to 6/10 now. Not yet done physical therapy. Taking hydrocodone but puts him to sleep. Ibuprofen also. No catching, locking. Using a crutch which helps - difficulty walking on this leg.  9/17: Patient reports he is doing much better following 3 visits with PT. Feels improved with stretching (sore when doing this but feels better after). Taking ibuprofen, had taken hydrocodone. No catching, locking of hip.  Past Medical History  Diagnosis Date  . Migraine     Current Outpatient Prescriptions on File Prior to Visit  Medication Sig Dispense Refill  . traMADol (ULTRAM) 50 MG tablet Take 1 tablet (50 mg total) by mouth every 6 (six) hours as needed.  60 tablet  0   No current facility-administered medications on file prior to visit.    Past Surgical History  Procedure Laterality Date  . Dental surgery      No Known Allergies  History   Social History  . Marital Status: Legally Separated    Spouse Name: N/A    Number of Children: N/A  . Years of Education: N/A   Occupational History  . Not on file.   Social History Main Topics  . Smoking status: Current Every Day  Smoker -- 0.25 packs/day    Types: Cigarettes  . Smokeless tobacco: Not on file  . Alcohol Use: Yes     Comment: socially  . Drug Use: Yes    Special: Marijuana  . Sexual Activity: Not on file     Comment: 2 weeks   Other Topics Concern  . Not on file   Social History Narrative  . No narrative on file    No family history on file.  BP 114/74  Pulse 81  Ht 6\' 3"  (1.905 m)  Wt 205 lb (92.987 kg)  BMI 25.62 kg/m2  Review of Systems: See HPI above.    Objective:  Physical Exam:  Gen: NAD  Right hip: No gross deformity, swelling, bruising, defect. Minimal TTP over proximal quad, hip flexor.  No lateral, posterior hip or back tenderness. FROM hip with minimal pain on IR only. Minimal pain with hip flexion.  5/5 strength all motions. Sensation intact to light touch. SLR negative.    Assessment & Plan:  1. Right hip pain - Radiographs, MRI reassuring.  2/2 hip flexor, proximal quad strains.  Doing well with physical therapy - will continue this and HEP.  Ice or heat 15 minutes at a time 3-4 times a day as needed.  Aleve 2 tabs twice a day with food OR ibuprofen 600mg  three times a day with food for pain and inflammation.  Has tramadol now to take as  needed.  Return to light duty.  F/u in 4 weeks.

## 2014-03-30 ENCOUNTER — Ambulatory Visit (INDEPENDENT_AMBULATORY_CARE_PROVIDER_SITE_OTHER): Payer: BC Managed Care – PPO | Admitting: Family Medicine

## 2014-03-30 ENCOUNTER — Encounter: Payer: Self-pay | Admitting: Family Medicine

## 2014-03-30 VITALS — BP 115/76 | HR 96 | Ht 76.0 in | Wt 210.0 lb

## 2014-03-30 DIAGNOSIS — M79604 Pain in right leg: Secondary | ICD-10-CM

## 2014-04-03 ENCOUNTER — Encounter: Payer: Self-pay | Admitting: Family Medicine

## 2014-04-03 NOTE — Progress Notes (Signed)
Patient ID: Ronald Jackson, male   DOB: 12-18-1980, 33 y.o.   MRN: [id number]  PCP: No PCP Per Patient  Subjective:   HPI: Patient is a 33 y.o. male here for right leg pain.  7/16: Patient reports he was playing basketball on 7/5. After getting a rebound he landed normally but later getting into a defensive stance and turning felt a sharp pain in anterior right groin area. Stopped playing as a result. Given muscle relaxant by ED - makes him drowsy though. Worse with walking. No bruising or swelling. Worse with abrupt motions too. No prior issues.  8/3: Patient returns and is a little better than last visit. Dull pain in same area of right groin. Interested in doing physical therapy. No catching, locking. No numbness/tingling or back pain.  8/12: Patient returns early due to severity of pain. Up to 6/10 now. Not yet done physical therapy. Taking hydrocodone but puts him to sleep. Ibuprofen also. No catching, locking. Using a crutch which helps - difficulty walking on this leg.  9/17: Patient reports he is doing much better following 3 visits with PT. Feels improved with stretching (sore when doing this but feels better after). Taking ibuprofen, had taken hydrocodone. No catching, locking of hip.  10/15: Patient reports he is doing very well. Some discomfort at times but no pain. No swelling. Anxious to get back to full duty. No other complaints.  Past Medical History  Diagnosis Date  . Migraine     Current Outpatient Prescriptions on File Prior to Visit  Medication Sig Dispense Refill  . traMADol (ULTRAM) 50 MG tablet Take 1 tablet (50 mg total) by mouth every 6 (six) hours as needed.  60 tablet  0   No current facility-administered medications on file prior to visit.    Past Surgical History  Procedure Laterality Date  . Dental surgery      No Known Allergies  History   Social History  . Marital Status: Legally Separated    Spouse Name: N/A   Number of Children: N/A  . Years of Education: N/A   Occupational History  . Not on file.   Social History Main Topics  . Smoking status: Current Every Day Smoker -- 0.25 packs/day    Types: Cigarettes  . Smokeless tobacco: Not on file  . Alcohol Use: Yes     Comment: socially  . Drug Use: Yes    Special: Marijuana  . Sexual Activity: Not on file     Comment: 2 weeks   Other Topics Concern  . Not on file   Social History Narrative  . No narrative on file    No family history on file.  BP 115/76  Pulse 96  Ht 6\' 4"  (1.93 m)  Wt 210 lb (95.255 kg)  BMI 25.57 kg/m2  Review of Systems: See HPI above.    Objective:  Physical Exam:  Gen: NAD  Right hip: No gross deformity, swelling, bruising, defect. No TTP over proximal quad, hip flexor.  No lateral, posterior hip or back tenderness. FROM without pain. No pain with hip flexion.  5/5 strength all motions. Sensation intact to light touch. SLR negative.    Assessment & Plan:  1. Right hip pain - Radiographs, MRI reassuring.  2/2 hip flexor, proximal quad strains.  Significantly improved.  Continue HEP.  Return to full duty at this time.  F/u prn.

## 2014-04-03 NOTE — Assessment & Plan Note (Signed)
Radiographs, MRI reassuring.  2/2 hip flexor, proximal quad strains.  Significantly improved.  Continue HEP.  Return to full duty at this time.  F/u prn.

## 2014-04-12 ENCOUNTER — Ambulatory Visit (INDEPENDENT_AMBULATORY_CARE_PROVIDER_SITE_OTHER): Payer: BC Managed Care – PPO | Admitting: Family Medicine

## 2014-04-12 ENCOUNTER — Encounter: Payer: Self-pay | Admitting: Family Medicine

## 2014-04-12 ENCOUNTER — Ambulatory Visit: Payer: BC Managed Care – PPO | Admitting: Family Medicine

## 2014-04-12 VITALS — BP 134/96 | HR 85 | Ht 75.0 in | Wt 215.0 lb

## 2014-04-12 DIAGNOSIS — M79604 Pain in right leg: Secondary | ICD-10-CM

## 2014-04-12 NOTE — Patient Instructions (Signed)
Make sure you're doing your home strengthening exercises every day (usually 3 sets of 10) until your pain has resolved. Try to do stretches at work when you can. Compression shorts are something to consider under your work uniform. Tylenol, ibuprofen or aleve, plus topical medications (capsaicin, aspercreme, biofreeze) are the over the counter medicines you can try in combination to help for the pain Active release by a chiropractor is a consideration but I don't know if that would make much of a difference.

## 2014-04-13 ENCOUNTER — Telehealth: Payer: Self-pay | Admitting: Family Medicine

## 2014-04-13 NOTE — Telephone Encounter (Signed)
Tried to contact patient. Unable to reach at this time.

## 2014-04-13 NOTE — Telephone Encounter (Signed)
No.  He should use the measures we discussed at his office visit yesterday to help control the pain.

## 2014-04-13 NOTE — Telephone Encounter (Signed)
Patient called and left message. Returned call and left message.

## 2014-04-14 ENCOUNTER — Encounter: Payer: Self-pay | Admitting: Family Medicine

## 2014-04-14 NOTE — Progress Notes (Signed)
Patient ID: Ronald Jackson, male   DOB: Jun 28, 1980, 33 y.o.   MRN: [id number]  PCP: No PCP Per Patient  Subjective:   HPI: Patient is a 33 y.o. male here for right leg pain.  7/16: Patient reports he was playing basketball on 7/5. After getting a rebound he landed normally but later getting into a defensive stance and turning felt a sharp pain in anterior right groin area. Stopped playing as a result. Given muscle relaxant by ED - makes him drowsy though. Worse with walking. No bruising or swelling. Worse with abrupt motions too. No prior issues.  8/3: Patient returns and is a little better than last visit. Dull pain in same area of right groin. Interested in doing physical therapy. No catching, locking. No numbness/tingling or back pain.  8/12: Patient returns early due to severity of pain. Up to 6/10 now. Not yet done physical therapy. Taking hydrocodone but puts him to sleep. Ibuprofen also. No catching, locking. Using a crutch which helps - difficulty walking on this leg.  9/17: Patient reports he is doing much better following 3 visits with PT. Feels improved with stretching (sore when doing this but feels better after). Taking ibuprofen, had taken hydrocodone. No catching, locking of hip.  10/15: Patient reports he is doing very well. Some discomfort at times but no pain. No swelling. Anxious to get back to full duty. No other complaints.  10/28: Patient reports pain has worsened when he returned to work. Bothers about half way through a 12 hour shift. Causes him to limp. On feet a lot - unable to work with restrictions for the most part. Feels stiff. No new injuries.  Past Medical History  Diagnosis Date  . Migraine     Current Outpatient Prescriptions on File Prior to Visit  Medication Sig Dispense Refill  . traMADol (ULTRAM) 50 MG tablet Take 1 tablet (50 mg total) by mouth every 6 (six) hours as needed.  60 tablet  0   No current  facility-administered medications on file prior to visit.    Past Surgical History  Procedure Laterality Date  . Dental surgery      No Known Allergies  History   Social History  . Marital Status: Legally Separated    Spouse Name: N/A    Number of Children: N/A  . Years of Education: N/A   Occupational History  . Not on file.   Social History Main Topics  . Smoking status: Current Every Day Smoker -- 0.25 packs/day    Types: Cigarettes  . Smokeless tobacco: Not on file  . Alcohol Use: Yes     Comment: socially  . Drug Use: Yes    Special: Marijuana  . Sexual Activity: Not on file     Comment: 2 weeks   Other Topics Concern  . Not on file   Social History Narrative  . No narrative on file    No family history on file.  BP 134/96  Pulse 85  Ht 6\' 3"  (1.905 m)  Wt 215 lb (97.523 kg)  BMI 26.87 kg/m2  Review of Systems: See HPI above.    Objective:  Physical Exam:  Gen: NAD  Right hip: No gross deformity, swelling, bruising, defect. No TTP over proximal quad, hip flexor.  No lateral, posterior hip or back tenderness. FROM without pain. No pain with hip flexion.  5/5 strength all motions. Sensation intact to light touch. SLR negative.    Assessment & Plan:  1. Right hip pain -  Radiographs, MRI reassuring.  2/2 hip flexor, proximal quad strains though exam currently is normal.  He is getting soreness which is not unexpected as he was unable to return to limited duty before returning to full duty per his company.  Encouraged him to do home exercises every day until pain has resolved.  Try stretching at work when he can.  Compression shorts may help.  Tylenol, ibuprofen or aleve, plus topical medications (capsaicin, aspercreme, biofreeze) to help for the pain.  Consider active release.  Follow up as needed.

## 2014-04-14 NOTE — Telephone Encounter (Signed)
Spoke with patient and advised him that we would not refill the Tramadol. Told the patient to do what he and the physician discussed at last office visit (take Tylenol, ibuprofen, use Aspercreme, Biofreeze, Capsasian). Patient stated that he was taking ibuprofen and using aspercreme. Told him that he could try biofreeze and see how that worked.

## 2014-04-14 NOTE — Assessment & Plan Note (Signed)
Radiographs, MRI reassuring.  2/2 hip flexor, proximal quad strains though exam currently is normal.  He is getting soreness which is not unexpected as he was unable to return to limited duty before returning to full duty per his company.  Encouraged him to do home exercises every day until pain has resolved.  Try stretching at work when he can.  Compression shorts may help.  Tylenol, ibuprofen or aleve, plus topical medications (capsaicin, aspercreme, biofreeze) to help for the pain.  Consider active release.  Follow up as needed.

## 2015-01-09 ENCOUNTER — Ambulatory Visit (INDEPENDENT_AMBULATORY_CARE_PROVIDER_SITE_OTHER): Payer: BLUE CROSS/BLUE SHIELD | Admitting: Podiatry

## 2015-01-09 ENCOUNTER — Encounter: Payer: Self-pay | Admitting: Podiatry

## 2015-01-09 ENCOUNTER — Telehealth: Payer: Self-pay | Admitting: *Deleted

## 2015-01-09 ENCOUNTER — Ambulatory Visit (INDEPENDENT_AMBULATORY_CARE_PROVIDER_SITE_OTHER): Payer: BLUE CROSS/BLUE SHIELD

## 2015-01-09 VITALS — BP 115/50 | HR 89 | Resp 16 | Ht 75.0 in | Wt 195.0 lb

## 2015-01-09 DIAGNOSIS — Q828 Other specified congenital malformations of skin: Secondary | ICD-10-CM | POA: Diagnosis not present

## 2015-01-09 DIAGNOSIS — M898X9 Other specified disorders of bone, unspecified site: Secondary | ICD-10-CM

## 2015-01-09 DIAGNOSIS — L603 Nail dystrophy: Secondary | ICD-10-CM

## 2015-01-09 DIAGNOSIS — M205X9 Other deformities of toe(s) (acquired), unspecified foot: Secondary | ICD-10-CM | POA: Diagnosis not present

## 2015-01-09 DIAGNOSIS — M2020 Hallux rigidus, unspecified foot: Secondary | ICD-10-CM

## 2015-01-09 DIAGNOSIS — M722 Plantar fascial fibromatosis: Secondary | ICD-10-CM

## 2015-01-09 NOTE — Telephone Encounter (Signed)
B/L 1st toenail fragments sent to [facility] for definitive diagnosis of fungal elements.

## 2015-01-09 NOTE — Progress Notes (Signed)
   Subjective:    Patient ID: Ronald Jackson, male    DOB: 1980/07/24, 34 y.o.   MRN: [id number]  HPI Comments: "I have had problems for years with these feet"  Patient c/o severe aching plantar 1st toes bilateral (R>L)  and plantar forefoot bilateral (L>R) for several years. The areas are callused and really sensitive. Some days he can barely walk. Tries to keep toes "crunched up" in his shoes so he can work. Tried foot soaks, lotions-no help.     Review of Systems  Constitutional: Positive for unexpected weight change.  HENT: Positive for ear pain.   Musculoskeletal: Positive for gait problem.  All other systems reviewed and are negative.      Objective:   Physical Exam: I have reviewed his past medical history medications allergies surgery social history and review of systems. Pulses are strongly palpable bilateral. Neurologic sensorium is intact versus once the monofilament. Deep tendon reflexes are intact bilateral muscle strength is 5 over 5 dorsiflexion plantar flexors and inverters everters on physical musculature is intact. Orthopedic evaluation Mr. is all just distal ankle full range of motion without crepitus with exception of the first metatarsophalangeal joints bilateral which demonstrated an elevated first metatarsal and hallux limitus bilateral. This is now resulted in a extended hallux at the IP joint with an area of reactive hyperkeratosis plantar aspect of the hallux right. Cutaneous evaluation demonstrates supple well-hydrated cutis thick yellow dystrophic clinically mycotic nails with subungual debris are present. He also has reactive hyperkeratosis IP joint hallux bilateral.        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 Porokeratosis and hyperkeratoses bilateral. Nail dystrophy hallux bilateral.  Plan: Discussed etiology pathology conservative versus surgical therapies. Discussed  the fact that more the likely would have to perform a decompression osteotomy to the right first metatarsophalangeal joint at some point in future. I would also excise an hallux interphalangeal sesamoid. He was scanned today for a set of orthotics. Debrided all reactive hyperkeratoses. Took samples of the nail and skin to be sent for pathologic evaluation. We will notify him once these results return.

## 2015-01-18 ENCOUNTER — Ambulatory Visit: Payer: BLUE CROSS/BLUE SHIELD | Admitting: Podiatry

## 2015-02-03 ENCOUNTER — Encounter (HOSPITAL_COMMUNITY): Payer: Self-pay

## 2015-02-03 ENCOUNTER — Emergency Department (HOSPITAL_COMMUNITY)
Admission: EM | Admit: 2015-02-03 | Discharge: 2015-02-04 | Discharge status: Against Medical Advice | Payer: BLUE CROSS/BLUE SHIELD | Attending: Emergency Medicine | Admitting: Emergency Medicine

## 2015-02-03 DIAGNOSIS — M79604 Pain in right leg: Secondary | ICD-10-CM | POA: Insufficient documentation

## 2015-02-03 DIAGNOSIS — M79605 Pain in left leg: Secondary | ICD-10-CM | POA: Insufficient documentation

## 2015-02-03 DIAGNOSIS — Z720 Tobacco use: Secondary | ICD-10-CM | POA: Insufficient documentation

## 2015-02-03 NOTE — ED Notes (Signed)
Called for room x3, no response

## 2015-02-03 NOTE — ED Notes (Signed)
Pt states he has been having skin issues with his legs over a year and went to a dermatologist recently and was given a cream to put on pigmentation areas. The areas were bleeding this morning when he woke up and states the dermatologist didn't do any blood work or anything. The places have gotten worse.

## 2015-02-04 ENCOUNTER — Emergency Department (HOSPITAL_COMMUNITY)
Admission: EM | Admit: 2015-02-04 | Discharge: 2015-02-04 | Disposition: A | Discharge status: Home / Self Care | Payer: BLUE CROSS/BLUE SHIELD | Attending: Emergency Medicine | Admitting: Emergency Medicine

## 2015-02-04 ENCOUNTER — Encounter (HOSPITAL_COMMUNITY): Payer: Self-pay | Admitting: *Deleted

## 2015-02-04 DIAGNOSIS — Z8679 Personal history of other diseases of the circulatory system: Secondary | ICD-10-CM | POA: Diagnosis not present

## 2015-02-04 DIAGNOSIS — Z720 Tobacco use: Secondary | ICD-10-CM | POA: Diagnosis not present

## 2015-02-04 DIAGNOSIS — R21 Rash and other nonspecific skin eruption: Secondary | ICD-10-CM | POA: Diagnosis present

## 2015-02-04 NOTE — ED Provider Notes (Signed)
[name]: [id number]     Arrival date & time 02/04/15  5009 History   First MD Initiated Contact with Patient 02/04/15 0848     Chief Complaint  Patient presents with  . Rash     (Consider location/radiation/quality/duration/timing/severity/associated sxs/prior Treatment) HPI Comments: Pt is a 34 yo male who presents to the ED with complaint of rash. Pt reports that he has had a rash on his lower extremities for appx 1 year now. Endorses intense itching and burning. He states the rash started as a few small white lesions on his shins that have now spread on his anterior legs, feet and arms. He notes that the lesions worsened this spring which he correlates to sweating a lot from being in hot conditions at work. Pt states he saw a dermatologist last week and was given clobetasol propionate ointment 0.05% which he has used without relief. He notes the lesions started swelling, crusting and peeling since using the ointment and have now become pink in coloration. He reports bleeding from the lesions that he noticed while at work this week. Denies fever, headache, SOB, CP, abdominal pain, N/V, myalgias, numbness, tingling. Pt denies any changes in soaps, detergents, lotions or taking new medications.   Patient is a 34 y.o. male presenting with rash.  Rash   Past Medical History  Diagnosis Date  . Migraine    Past Surgical History  Procedure Laterality Date  . Dental surgery     History reviewed. No pertinent family history. Social History  Substance Use Topics  . Smoking status: Current Every Day Smoker -- 0.25 packs/day    Types: Cigarettes  . Smokeless tobacco: None  . Alcohol Use: Yes     Comment: socially    Review of Systems  Skin: Positive for rash.  All other systems reviewed and are negative.     Allergies  Review of patient's allergies indicates no known allergies.  Home Medications   Prior to Admission medications   Not on File   BP 134/87 mmHg  Pulse 87  Temp(Src)  98.5 F (36.9 C) (Oral)  Resp 16  Ht 6\' 3"  (1.905 m)  Wt 195 lb (88.451 kg)  BMI 24.37 kg/m2  SpO2 100% Physical Exam  Constitutional: He is oriented to person, place, and time. He appears well-developed and well-nourished.  HENT:  Head: Normocephalic and atraumatic.  Eyes: Conjunctivae and EOM are normal. Right eye exhibits no discharge. Left eye exhibits no discharge. No scleral icterus.  Neck: Normal range of motion.  Cardiovascular: Normal rate.   Pulmonary/Chest: Effort normal.  Musculoskeletal: Normal range of motion.  Neurological: He is alert and oriented to person, place, and time.  Skin: Skin is warm and dry. Rash noted.  Blanching erythematous maculopapular lesions present at anterior shins and few small lesions noted on anterior left foot and dorsal forearm, lesions have mild surrounding swelling, no excoriations, no drainage, no crusting, no bullae, no vesicles, no active bleeding.     ED Course  Procedures (including critical care time) Labs Review Labs Reviewed - No data to display  Imaging Review No results found. I have personally reviewed and evaluated these images and lab results as part of my medical decision-making.  Filed Vitals:   02/04/15 0844  BP: 134/87  Pulse: 87  Temp: 98.5 F (36.9 C)  Resp: 16     MDM   Final diagnoses:  Rash and nonspecific skin eruption    Pt presents with rash that has been present for over a  year but worsened in the spring when he started sweating more due to being in hot conditions at work. No improvement of sxs with use of clobetasol propionate ointment given by dermatologist.   Rash does not appear to be of infectious origin. Will not rx antibiotic and recommend for pt to continue to use steroid ointment.  Spoke with pt regarding use of steroid ointment dermatologist rx and he reports he has been having difficulty using the ointment BID as prescribed due to his work schedule.   Discussed with pt plan to continue  using steroid ointment, follow up with his dermatologist tomorrow and recommendations to try to keep his lower extremities dry while at work. Pt agrees with plan.    Chesley Noon Matthews, [place] 02/04/15 0100  Sherwood Gambler, MD 02/04/15 1010

## 2015-02-04 NOTE — ED Notes (Signed)
Declined W/C at D/C and was escorted to lobby by RN. 

## 2015-02-04 NOTE — Discharge Instructions (Signed)
Please follow up with your dermatologist and continue to use the steroid ointment as prescribed along with trying to keep socks and lower extremity area dry while at work .  Please return to the Emergency department if symptoms worsen.

## 2015-02-04 NOTE — ED Notes (Signed)
Pt presents today with a rash that is worse . Pt reports after going to Dermatologist last week he feels the rash is worse. Pt has been using clobetasol propionate oint. 0.05% to rash areas on lower legs and feet. Pt reports burning and itching to rash sites while at work.

## 2015-02-06 ENCOUNTER — Ambulatory Visit: Payer: BLUE CROSS/BLUE SHIELD | Admitting: *Deleted

## 2015-02-06 ENCOUNTER — Telehealth: Payer: Self-pay | Admitting: *Deleted

## 2015-02-06 DIAGNOSIS — M205X9 Other deformities of toe(s) (acquired), unspecified foot: Secondary | ICD-10-CM

## 2015-02-06 DIAGNOSIS — Z79899 Other long term (current) drug therapy: Secondary | ICD-10-CM

## 2015-02-06 DIAGNOSIS — M2020 Hallux rigidus, unspecified foot: Principal | ICD-10-CM

## 2015-02-06 NOTE — Telephone Encounter (Signed)
Dr. Milinda Pointer reviewed pt's fungal culture as + and prescribed Onmel 200mg  one tablet qd x30days with 2 refills.  I spoke with Dr. Stephenie Acres assistant Caryl Pina Prevatte and Dr. Milinda Pointer does Hepatic function and CBC with Diff prior to beginning medication, then sees pt back in 4 months.  I informed pt of Dr. Stephenie Acres recommendations and pt will be in the office today to pick up orthotics and labs.

## 2015-02-06 NOTE — Progress Notes (Signed)
Patient ID: Ronald Jackson, male   DOB: 12-17-1980, 34 y.o.   MRN: [id number] Patient presents for orthotic pick up.  Verbal and written break in and wear instructions given.  Patient will follow up in 4 weeks if symptoms worsen or fail to improve.

## 2015-02-06 NOTE — Patient Instructions (Signed)

## 2015-03-06 ENCOUNTER — Encounter: Payer: Self-pay | Admitting: Podiatry

## 2015-05-07 ENCOUNTER — Telehealth: Payer: Self-pay | Admitting: *Deleted

## 2015-05-07 NOTE — Telephone Encounter (Signed)
Pt states he lost his paperwork for lab blood work.  I reprinted for the pt and told him he could go have the blood drawn now.

## 2015-05-08 LAB — CBC WITH DIFFERENTIAL/PLATELET
Basophils Absolute: 0.1 10*3/uL (ref 0.0–0.1)
Basophils Relative: 1 % (ref 0–1)
Eosinophils Absolute: 0.4 10*3/uL (ref 0.0–0.7)
Eosinophils Relative: 7 % — ABNORMAL HIGH (ref 0–5)
HCT: 37.8 % — ABNORMAL LOW (ref 39.0–52.0)
Hemoglobin: 12.8 g/dL — ABNORMAL LOW (ref 13.0–17.0)
Lymphocytes Relative: 28 % (ref 12–46)
Lymphs Abs: 1.7 10*3/uL (ref 0.7–4.0)
MCH: 28.5 pg (ref 26.0–34.0)
MCHC: 33.9 g/dL (ref 30.0–36.0)
MCV: 84.2 fL (ref 78.0–100.0)
MPV: 11.5 fL (ref 8.6–12.4)
Monocytes Absolute: 0.5 10*3/uL (ref 0.1–1.0)
Monocytes Relative: 8 % (ref 3–12)
Neutro Abs: 3.3 10*3/uL (ref 1.7–7.7)
Neutrophils Relative %: 56 % (ref 43–77)
Platelets: 169 10*3/uL (ref 150–400)
RBC: 4.49 MIL/uL (ref 4.22–5.81)
RDW: 15.2 % (ref 11.5–15.5)
WBC: 5.9 10*3/uL (ref 4.0–10.5)

## 2015-05-08 LAB — HEPATIC FUNCTION PANEL
ALT: 8 U/L — ABNORMAL LOW (ref 9–46)
AST: 12 U/L (ref 10–40)
Albumin: 4.1 g/dL (ref 3.6–5.1)
Alkaline Phosphatase: 42 U/L (ref 40–115)
Bilirubin, Direct: 0.1 mg/dL (ref <=0.2)
Indirect Bilirubin: 0.2 mg/dL (ref 0.2–1.2)
Total Bilirubin: 0.3 mg/dL (ref 0.2–1.2)
Total Protein: 6.8 g/dL (ref 6.1–8.1)

## 2015-05-15 ENCOUNTER — Telehealth: Payer: Self-pay | Admitting: *Deleted

## 2015-05-15 NOTE — Telephone Encounter (Addendum)
-----   Message from Garrel Ridgel, [place] sent at 05/15/2015  7:14 AM EST ----- LIVER PROFILE LOOKS GOOD AND MAY CONTINUE MEDICATION. Informed pt of orders.

## 2015-05-23 ENCOUNTER — Telehealth: Payer: Self-pay | Admitting: *Deleted

## 2015-05-23 NOTE — Telephone Encounter (Signed)
"  Please call me back at your earliest convenience."

## 2015-05-30 NOTE — Telephone Encounter (Signed)
I attempted to call patient.  I left him a message to call me back. 

## 2015-06-01 NOTE — Telephone Encounter (Signed)
I'm returning your call.  How can I help you?  "Yes, I'd like to get an estimate for the surgery that I need.  The codes are [id number] and [id number]."  I'll have to get Jocelyn Lamer to call you back with that information.  "I think I talked to her before and she gave me the information.  I just need it to be faxed.  "I'll ask Jocelyn Lamer to take care of this.  What is your fax number?  "It is [phone]."  What insurance do you have?  "I have blue cross blue shields."

## 2015-06-05 ENCOUNTER — Encounter (HOSPITAL_COMMUNITY): Payer: Self-pay | Admitting: *Deleted

## 2015-06-05 ENCOUNTER — Emergency Department (HOSPITAL_COMMUNITY)
Admission: EM | Admit: 2015-06-05 | Discharge: 2015-06-05 | Disposition: A | Discharge status: Home / Self Care | Payer: BLUE CROSS/BLUE SHIELD | Attending: Emergency Medicine | Admitting: Emergency Medicine

## 2015-06-05 ENCOUNTER — Emergency Department (HOSPITAL_COMMUNITY): Payer: BLUE CROSS/BLUE SHIELD

## 2015-06-05 DIAGNOSIS — R05 Cough: Secondary | ICD-10-CM | POA: Diagnosis present

## 2015-06-05 DIAGNOSIS — J189 Pneumonia, unspecified organism: Secondary | ICD-10-CM

## 2015-06-05 DIAGNOSIS — J159 Unspecified bacterial pneumonia: Secondary | ICD-10-CM | POA: Diagnosis not present

## 2015-06-05 DIAGNOSIS — R000 Tachycardia, unspecified: Secondary | ICD-10-CM | POA: Insufficient documentation

## 2015-06-05 DIAGNOSIS — Z7952 Long term (current) use of systemic steroids: Secondary | ICD-10-CM | POA: Insufficient documentation

## 2015-06-05 DIAGNOSIS — F17210 Nicotine dependence, cigarettes, uncomplicated: Secondary | ICD-10-CM | POA: Insufficient documentation

## 2015-06-05 DIAGNOSIS — R1110 Vomiting, unspecified: Secondary | ICD-10-CM | POA: Diagnosis not present

## 2015-06-05 DIAGNOSIS — E860 Dehydration: Secondary | ICD-10-CM

## 2015-06-05 DIAGNOSIS — N179 Acute kidney failure, unspecified: Secondary | ICD-10-CM | POA: Diagnosis not present

## 2015-06-05 DIAGNOSIS — Z8679 Personal history of other diseases of the circulatory system: Secondary | ICD-10-CM | POA: Insufficient documentation

## 2015-06-05 LAB — BASIC METABOLIC PANEL
Anion gap: 11 (ref 5–15)
BUN: 8 mg/dL (ref 6–20)
CO2: 27 mmol/L (ref 22–32)
Calcium: 9.6 mg/dL (ref 8.9–10.3)
Chloride: 100 mmol/L — ABNORMAL LOW (ref 101–111)
Creatinine, Ser: 1.25 mg/dL — ABNORMAL HIGH (ref 0.61–1.24)
GFR calc Af Amer: >60 mL/min (ref >60)
GFR calc non Af Amer: >60 mL/min (ref >60)
Glucose, Bld: 95 mg/dL (ref 65–99)
Potassium: 3.9 mmol/L (ref 3.5–5.1)
Sodium: 138 mmol/L (ref 135–145)

## 2015-06-05 LAB — CBC
HCT: 41.7 % (ref 39.0–52.0)
Hemoglobin: 14.5 g/dL (ref 13.0–17.0)
MCH: 28.7 pg (ref 26.0–34.0)
MCHC: 34.8 g/dL (ref 30.0–36.0)
MCV: 82.6 fL (ref 78.0–100.0)
Platelets: 150 10*3/uL (ref 150–400)
RBC: 5.05 MIL/uL (ref 4.22–5.81)
RDW: 14.4 % (ref 11.5–15.5)
WBC: 16.5 10*3/uL — ABNORMAL HIGH (ref 4.0–10.5)

## 2015-06-05 LAB — TROPONIN I: Troponin I: <0.03 ng/mL (ref <0.031)

## 2015-06-05 MED ORDER — IBUPROFEN 800 MG PO TABS
800.0000 mg | ORAL_TABLET | Freq: Once | ORAL | Status: AC
  Administered 2015-06-05: 800 mg via ORAL
  Filled 2015-06-05: qty 1

## 2015-06-05 MED ORDER — DEXTROSE 5 % IV SOLN
1.0000 g | Freq: Once | INTRAVENOUS | Status: AC
  Administered 2015-06-05: 1 g via INTRAVENOUS
  Filled 2015-06-05: qty 10

## 2015-06-05 MED ORDER — ACETAMINOPHEN 500 MG PO TABS
1000.0000 mg | ORAL_TABLET | Freq: Once | ORAL | Status: AC
  Administered 2015-06-05: 1000 mg via ORAL
  Filled 2015-06-05: qty 2

## 2015-06-05 MED ORDER — SODIUM CHLORIDE 0.9 % IV BOLUS (SEPSIS)
1000.0000 mL | Freq: Once | INTRAVENOUS | Status: AC
  Administered 2015-06-05: 1000 mL via INTRAVENOUS

## 2015-06-05 MED ORDER — AZITHROMYCIN 250 MG PO TABS: 250.0000 mg | ORAL_TABLET | Freq: Every day | ORAL | Status: DC

## 2015-06-05 NOTE — ED Notes (Signed)
MD at bedside. EDP LITTLE PRESENT  

## 2015-06-05 NOTE — Progress Notes (Signed)
[facility] ED CM noted pt with coverage but no pcp listed Spoke with pt who confirms no pcp  [facility] ED CM spoke with pt on how to obtain an in network pcp with insurance coverage via the customer service number or web site  Cm reviewed ED level of care for crisis/emergent services and community pcp level of care to manage continuous or chronic medical concerns.  The pt voiced understanding CM encouraged pt and discussed pt's responsibility to verify with pt's insurance carrier that any recommended medical provider offered by any emergency room or a hospital provider is within the carrier's network. The pt voiced understanding      

## 2015-06-05 NOTE — ED Notes (Addendum)
Per EMS, pt complains of fever, chills, flu like symptoms, chest pain for 2 days. Pt also complains of constipation for 4 days. Pt states he has been around people who have similar symptoms at work.

## 2015-06-05 NOTE — ED Provider Notes (Signed)
[name]: [id number]     Arrival date & time 06/05/15  1250 History   First MD Initiated Contact with Patient 06/05/15 1503     Chief Complaint  Patient presents with  . Cough  . Chills  . Chest Pain     (Consider location/radiation/quality/duration/timing/severity/associated sxs/prior Treatment) HPI Comments: 34 year old male who presents with cough, chills, and chest pain. The patient states that 2 days ago he began having a cough productive of reddish-orange sputum associated with constant left-sided chest pain, myalgias, generalized malaise, chills, and sweats. He is also noticed that his breathing is faster and slightly more labored. His chest pain and shortness of breath are worse with exertion. He had one episode of vomiting last night. He has had mild constipation with last bowel movement 2 days ago. No diarrhea, abdominal pain, or urinary symptoms. He states that several coworkers have had similar symptoms at work.  No recent travel or history of blood clots.  Patient is a 34 y.o. male presenting with cough and chest pain. The history is provided by the patient.  Cough Associated symptoms: chest pain   Chest Pain Associated symptoms: cough     Past Medical History  Diagnosis Date  . Migraine    Past Surgical History  Procedure Laterality Date  . Dental surgery     No family history on file. Social History  Substance Use Topics  . Smoking status: Current Every Day Smoker -- 0.25 packs/day    Types: Cigarettes  . Smokeless tobacco: None  . Alcohol Use: Yes     Comment: socially    Review of Systems  Respiratory: Positive for cough.   Cardiovascular: Positive for chest pain.   10 Systems reviewed and are negative for acute change except as noted in the HPI.   Allergies  Review of patient's allergies indicates no known allergies.  Home Medications   Prior to Admission medications   Medication Sig Start Date End Date Taking? Authorizing Provider  clobetasol  cream (TEMOVATE) [id number] % Apply 1 application topically daily.   Yes Historical Provider, MD  ibuprofen (ADVIL,MOTRIN) 200 MG tablet Take 400-600 mg by mouth every 6 (six) hours as needed.   Yes Historical Provider, MD  AFLURIA PRESERVATIVE FREE 0.5 ML SUSY 04/06/15 04/06/15   Historical Provider, MD  azithromycin (ZITHROMAX Z-PAK) 250 MG tablet Take 1 tablet (250 mg total) by mouth daily. 500mg  PO day 1, then 250mg  PO days 2-5 06/05/15   [name] Little, MD   BP 121/86 mmHg  Pulse 102  Temp(Src) 99 F (37.2 C) (Oral)  Resp 19  Ht 6\' 3"  (1.905 m)  Wt 190 lb (86.183 kg)  BMI 23.75 kg/m2  SpO2 98% Physical Exam  Constitutional: He is oriented to person, place, and time. He appears well-developed and well-nourished.  Uncomfortable but nontoxic and in no acute distress  HENT:  Head: Normocephalic and atraumatic.  dry mucous membranes  Eyes: Conjunctivae are normal. Pupils are equal, round, and reactive to light.  Neck: Normal range of motion. Neck supple.  Cardiovascular: Regular rhythm and normal heart sounds.   No murmur heard. Tachycardic  Pulmonary/Chest: Effort normal. No respiratory distress. He has no wheezes.  Crackles left lower lung  Abdominal: Soft. Bowel sounds are normal. He exhibits no distension. There is no tenderness.  Musculoskeletal: He exhibits no edema.  Neurological: He is alert and oriented to person, place, and time.  Fluent speech  Skin: Skin is warm and dry.  Psychiatric: He has a normal mood and  affect. Judgment normal.  Nursing note and vitals reviewed.   ED Course  Procedures (including critical care time) Labs Review Labs Reviewed  BASIC METABOLIC PANEL - Abnormal; Notable for the following:    Chloride 100 (*)    Creatinine, Ser 1.25 (*)    All other components within normal limits  CBC - Abnormal; Notable for the following:    WBC 16.5 (*)    All other components within normal limits  TROPONIN I    Imaging Review Dg Chest 2  View  06/05/2015  CLINICAL DATA:  Left-sided chest pain, productive cough, fever and chills for the past 3 days EXAM: CHEST  2 VIEW COMPARISON:  PA and lateral chest x-ray of June 10, 2013 FINDINGS: The lungs are well-expanded. There are bibasilar infiltrates. There is no pleural effusion or pneumothorax. The heart and pulmonary vascularity are normal. The mediastinum is normal in width. There is no pleural effusion. The bony thorax exhibits no acute abnormality. IMPRESSION: Bibasilar pneumonia. Followup PA and lateral chest X-ray is recommended in 3-4 weeks following trial of antibiotic therapy to ensure resolution and exclude underlying malignancy. Electronically Signed   By: David  Martinique M.D.   On: 06/05/2015 14:46   I have personally reviewed and evaluated these images and lab results as part of my medical decision-making.   EKG Interpretation   Date/Time:  Tuesday June 05 2015 13:16:06 EST Ventricular Rate:  110 PR Interval:  136 QRS Duration: 75 QT Interval:  302 QTC Calculation: 408 R Axis:   80 Text Interpretation:  Sinus tachycardia Multiple ventricular premature  complexes RAE, consider biatrial enlargement LVH by voltage Anterior ST  elevation, probably due to LVH tachycardia new from previous, new T wave  inversion in III, aVF Confirmed by LITTLE MD, RACHEL [phone]) on 06/05/2015  7:02:29 PM     Medications  sodium chloride 0.9 % bolus 1,000 mL (0 mLs Intravenous Stopped 06/05/15 1815)  acetaminophen (TYLENOL) tablet 1,000 mg (1,000 mg Oral Given 06/05/15 1617)  sodium chloride 0.9 % bolus 1,000 mL (0 mLs Intravenous Stopped 06/05/15 1815)  cefTRIAXone (ROCEPHIN) 1 g in dextrose 5 % 50 mL IVPB (0 g Intravenous Stopped 06/05/15 1750)  ibuprofen (ADVIL,MOTRIN) tablet 800 mg (800 mg Oral Given 06/05/15 1816)    MDM   Final diagnoses:  Community acquired pneumonia  AKI (acute kidney injury) ([place])  Dehydration    Patient with 2 days of productive cough, chills  and fevers at home, constant left-sided chest pain, and increased work of breathing. On exam, the patient was uncomfortable but nontoxic. He was mildly tachycardic but afebrile, O2 sat 96% at presentation. On my exam, he was still borderline tachycardic, temp 100.2, O2 sat 92-95% on room air. Normal work of breathing, crackles noted left lower lung. Obtained above labs which show mild AK I with creatinine of 1.25, PVCs 16.5. Chest x-ray shows bibasilar pneumonia. The patient has had no recent hospitalizations and has no comorbidities, thus will treat for CAP. Gave CTX, IVF bolus, and tylenol.   On reexamination after fluid bolus, the patient's fever and tachycardia have improved and he remains stable on room air with normal work of breathing. He has been able to tolerate PO here.  Gave prescription for azithromycin to treat community-acquired pneumonia. Counseled on smoking cessation during this illness and reviewed supportive care instructions including continued hydration, Tylenol/Motrin as needed for fever, and strict compliance with antibiotics. I emphasized the importance of return Precautions including any worsening chest pain or shortness of breath.  Pt voiced understanding and was discharged in satisfactory condition.  Sharlett Iles, MD 06/06/15 [phone]

## 2015-06-05 NOTE — Progress Notes (Signed)
Entered in d/c instructions Please go to [email or web address], locate find a doctor area to use to find in network primary care provider and specialists Schedule an appointment as soon as possible for a visit  Please verify any provider recommended to you is in network

## 2015-06-05 NOTE — ED Notes (Signed)
MD at bedside. 

## 2017-01-27 ENCOUNTER — Encounter (HOSPITAL_COMMUNITY): Payer: Self-pay | Admitting: Emergency Medicine

## 2017-01-27 ENCOUNTER — Emergency Department (HOSPITAL_COMMUNITY)
Admission: EM | Admit: 2017-01-27 | Discharge: 2017-01-27 | Disposition: A | Discharge status: Home / Self Care | Payer: BLUE CROSS/BLUE SHIELD | Attending: Emergency Medicine | Admitting: Emergency Medicine

## 2017-01-27 ENCOUNTER — Emergency Department (HOSPITAL_COMMUNITY): Payer: BLUE CROSS/BLUE SHIELD

## 2017-01-27 DIAGNOSIS — R05 Cough: Secondary | ICD-10-CM

## 2017-01-27 DIAGNOSIS — R059 Cough, unspecified: Secondary | ICD-10-CM

## 2017-01-27 DIAGNOSIS — J069 Acute upper respiratory infection, unspecified: Secondary | ICD-10-CM | POA: Diagnosis not present

## 2017-01-27 DIAGNOSIS — R197 Diarrhea, unspecified: Secondary | ICD-10-CM | POA: Insufficient documentation

## 2017-01-27 DIAGNOSIS — R9431 Abnormal electrocardiogram [ECG] [EKG]: Secondary | ICD-10-CM | POA: Diagnosis not present

## 2017-01-27 DIAGNOSIS — R112 Nausea with vomiting, unspecified: Secondary | ICD-10-CM | POA: Diagnosis not present

## 2017-01-27 DIAGNOSIS — R079 Chest pain, unspecified: Secondary | ICD-10-CM | POA: Diagnosis not present

## 2017-01-27 DIAGNOSIS — F17210 Nicotine dependence, cigarettes, uncomplicated: Secondary | ICD-10-CM | POA: Diagnosis not present

## 2017-01-27 LAB — RAPID STREP SCREEN (MED CTR MEBANE ONLY): Streptococcus, Group A Screen (Direct): NEGATIVE

## 2017-01-27 LAB — CBC WITH DIFFERENTIAL/PLATELET
Basophils Absolute: 0 10*3/uL (ref 0.0–0.1)
Basophils Relative: 0 %
Eosinophils Absolute: 0.5 10*3/uL (ref 0.0–0.7)
Eosinophils Relative: 8 %
HCT: 37.1 % — ABNORMAL LOW (ref 39.0–52.0)
Hemoglobin: 13.2 g/dL (ref 13.0–17.0)
Lymphocytes Relative: 22 %
Lymphs Abs: 1.3 10*3/uL (ref 0.7–4.0)
MCH: 29.3 pg (ref 26.0–34.0)
MCHC: 35.6 g/dL (ref 30.0–36.0)
MCV: 82.3 fL (ref 78.0–100.0)
Monocytes Absolute: 0.8 10*3/uL (ref 0.1–1.0)
Monocytes Relative: 15 %
Neutro Abs: 3.2 10*3/uL (ref 1.7–7.7)
Neutrophils Relative %: 55 %
Platelets: 123 10*3/uL — ABNORMAL LOW (ref 150–400)
RBC: 4.51 MIL/uL (ref 4.22–5.81)
RDW: 14.3 % (ref 11.5–15.5)
WBC: 5.8 10*3/uL (ref 4.0–10.5)

## 2017-01-27 LAB — COMPREHENSIVE METABOLIC PANEL
ALT: 14 U/L — ABNORMAL LOW (ref 17–63)
AST: 17 U/L (ref 15–41)
Albumin: 3.8 g/dL (ref 3.5–5.0)
Alkaline Phosphatase: 36 U/L — ABNORMAL LOW (ref 38–126)
Anion gap: 6 (ref 5–15)
BUN: 8 mg/dL (ref 6–20)
CO2: 28 mmol/L (ref 22–32)
Calcium: 9.2 mg/dL (ref 8.9–10.3)
Chloride: 106 mmol/L (ref 101–111)
Creatinine, Ser: 1.17 mg/dL (ref 0.61–1.24)
GFR calc Af Amer: >60 mL/min (ref >60)
GFR calc non Af Amer: >60 mL/min (ref >60)
Glucose, Bld: 93 mg/dL (ref 65–99)
Potassium: 4.1 mmol/L (ref 3.5–5.1)
Sodium: 140 mmol/L (ref 135–145)
Total Bilirubin: 0.6 mg/dL (ref 0.3–1.2)
Total Protein: 7 g/dL (ref 6.5–8.1)

## 2017-01-27 LAB — I-STAT TROPONIN, ED
Troponin i, poc: 0 ng/mL (ref 0.00–0.08)
Troponin i, poc: 0.01 ng/mL (ref 0.00–0.08)

## 2017-01-27 LAB — LIPASE, BLOOD: Lipase: 40 U/L (ref 11–51)

## 2017-01-27 MED ORDER — BENZONATATE 100 MG PO CAPS
200.0000 mg | ORAL_CAPSULE | Freq: Once | ORAL | Status: AC
  Administered 2017-01-27: 200 mg via ORAL
  Filled 2017-01-27: qty 2

## 2017-01-27 MED ORDER — ACETAMINOPHEN 325 MG PO TABS
650.0000 mg | ORAL_TABLET | Freq: Once | ORAL | Status: AC
  Administered 2017-01-27: 650 mg via ORAL
  Filled 2017-01-27: qty 2

## 2017-01-27 MED ORDER — ONDANSETRON 4 MG PO TBDP: 4.0000 mg | ORAL_TABLET | Freq: Three times a day (TID) | ORAL | 0 refills | Status: DC | PRN

## 2017-01-27 MED ORDER — BENZONATATE 100 MG PO CAPS: 100.0000 mg | ORAL_CAPSULE | Freq: Three times a day (TID) | ORAL | 0 refills | Status: DC

## 2017-01-27 MED ORDER — ONDANSETRON 8 MG PO TBDP
8.0000 mg | ORAL_TABLET | Freq: Once | ORAL | Status: AC
  Administered 2017-01-27: 8 mg via ORAL
  Filled 2017-01-27: qty 1

## 2017-01-27 NOTE — ED Provider Notes (Signed)
[facility] DEPT Provider Note   [name]: [id number] Arrival date & time: 01/27/17  6384     History   Chief Complaint Chief Complaint  Patient presents with  . Chest Pain  . Cough    HPI [facility] is a 36 y.o. male.  HPI Patient presents to ED for multiple complaints. His first complaint is chest pain. He states that he's been experiencing intermittently for the past 3 days. He denies any aggravating or alleviating factors. He states that pain comes and goes at random times. It is associated with productive cough with tan colored sputum. He also reports chills, sore throat, rhinorrhea. He has tried taking airborne, Mucinex and ibuprofen with some relief in his symptoms. His next complaint is vomiting and diarrhea. He states that he has had multiple episodes of each since yesterday. He states his symptoms have improved today. He also reports generalized abdominal pain. He denies shortness of breath, hemoptysis, swelling, recent surgery, recent prolonged travel, history of prior MI, DVT or PE, cancer.  Past Medical History:  Diagnosis Date  . Migraine     Patient Active Problem List   Diagnosis Date Noted  . Right leg pain 01/02/2014    Past Surgical History:  Procedure Laterality Date  . DENTAL SURGERY         Home Medications    Prior to Admission medications   Medication Sig Start Date End Date Taking? Authorizing Provider  dextromethorphan-guaiFENesin (MUCINEX DM) 30-600 MG 12hr tablet Take 1 tablet by mouth 2 (two) times daily as needed for cough.   Yes [provider]  ibuprofen (ADVIL,MOTRIN) 200 MG tablet Take 400-600 mg by mouth every 6 (six) hours as needed.   Yes [provider]  Multiple Vitamins-Minerals (AIRBORNE PO) Take 1 packet by mouth 3 (three) times daily as needed (cold symptoms).   Yes [provider]  azithromycin (ZITHROMAX Z-PAK) 250 MG tablet Take 1 tablet (250 mg total) by mouth daily. 500mg  PO day 1, then  250mg  PO days 2-5 Patient not taking: Reported on 01/27/2017 06/05/15   Little, [name], MD  benzonatate (TESSALON) 100 MG capsule Take 1 capsule (100 mg total) by mouth every 8 (eight) hours. 01/27/17   Dub Maclellan, PA-C  ondansetron (ZOFRAN ODT) 4 MG disintegrating tablet Take 1 tablet (4 mg total) by mouth every 8 (eight) hours as needed for nausea or vomiting. 01/27/17   Delia Heady, PA-C    Family History History reviewed. No pertinent family history.  Social History Social History  Substance Use Topics  . Smoking status: Current Every Day Smoker    Packs/day: 0.25    Types: Cigarettes  . Smokeless tobacco: Never Used  . Alcohol use Yes     Comment: socially     Allergies   Patient has no known allergies.   Review of Systems Review of Systems  Constitutional: Positive for chills. Negative for appetite change and fever.  HENT: Positive for rhinorrhea, sneezing and sore throat. Negative for ear pain, trouble swallowing and voice change.   Eyes: Negative for photophobia and visual disturbance.  Respiratory: Positive for cough. Negative for chest tightness, shortness of breath and wheezing.   Cardiovascular: Positive for chest pain. Negative for palpitations.  Gastrointestinal: Positive for diarrhea and vomiting. Negative for abdominal pain, blood in stool, constipation and nausea.  Genitourinary: Negative for dysuria, hematuria and urgency.  Musculoskeletal: Negative for myalgias.  Skin: Negative for rash.  Neurological: Negative for dizziness, weakness and light-headedness.  Physical Exam Updated Vital Signs BP (!) 119/94   Pulse (!) 56   Temp 98 F (36.7 C)   Resp 18   Ht 6\' 4"  (1.93 m)   Wt 99.8 kg (220 lb)   SpO2 100%   BMI 26.78 kg/m   Physical Exam  Constitutional: He appears well-developed and well-nourished. No distress.  HENT:  Head: Normocephalic and atraumatic.  Nose: Nose normal.  Mouth/Throat: Uvula is midline and oropharynx is clear  and moist.  Patient does not appear to be in acute distress. No trismus or drooling present. No pooling of secretions. Patient is tolerating secretions and is not in respiratory distress. No neck pain or tenderness to palpation of the neck. Full active and passive range of motion of the neck. No evidence of RPA or PTA.  Eyes: Conjunctivae and EOM are normal. Right eye exhibits no discharge. Left eye exhibits no discharge. No scleral icterus.  Neck: Normal range of motion. Neck supple.  Cardiovascular: Normal rate, regular rhythm, normal heart sounds and intact distal pulses.  Exam reveals no gallop and no friction rub.   No murmur heard. Pulmonary/Chest: Effort normal and breath sounds normal. No respiratory distress.    Pain indicated in the area above. No changes with palpation.  Abdominal: Soft. Bowel sounds are normal. He exhibits no distension. There is tenderness (Generalized). There is no guarding.  Musculoskeletal: Normal range of motion. He exhibits no edema or tenderness.  No leg swelling noted. No calf tenderness.  Neurological: He is alert. He exhibits normal muscle tone. Coordination normal.  Skin: Skin is warm and dry. No rash noted.  Psychiatric: He has a normal mood and affect.  Nursing note and vitals reviewed.    ED Treatments / Results  Labs (all labs ordered are listed, but only abnormal results are displayed) Labs Reviewed  COMPREHENSIVE METABOLIC PANEL - Abnormal; Notable for the following:       Result Value   ALT 14 (*)    Alkaline Phosphatase 36 (*)    All other components within normal limits  CBC WITH DIFFERENTIAL/PLATELET - Abnormal; Notable for the following:    HCT 37.1 (*)    Platelets 123 (*)    All other components within normal limits  RAPID STREP SCREEN (NOT AT [facility])  CULTURE, GROUP A STREP [facility])  LIPASE, BLOOD  I-STAT TROPONIN, ED  I-STAT TROPONIN, ED    EKG  EKG Interpretation  Date/Time:  Tuesday January 27 2017 07:27:31  EDT Ventricular Rate:  63 PR Interval:    QRS Duration: 77 QT Interval:  379 QTC Calculation: 388 R Axis:   75 Text Interpretation:  Sinus rhythm Atrial premature complex Consider left atrial enlargement Consider left ventricular hypertrophy No significant change since last tracing Confirmed by Dorie Rank [phone]) on 01/27/2017 7:32:33 AM Also confirmed by Dorie Rank [phone]), editor Radene Gunning [phone])  on 01/27/2017 7:42:10 AM       Radiology Dg Chest 2 View  Result Date: 01/27/2017 CLINICAL DATA:  Left mid chest pain with coughing congestion. EXAM: CHEST  2 VIEW COMPARISON:  06/05/2015 FINDINGS: The lungs are clear without focal pneumonia, edema, pneumothorax or pleural effusion. The cardiopericardial silhouette is within normal limits for size. The visualized bony structures of the thorax are intact. Telemetry leads overlie the chest. IMPRESSION: No active cardiopulmonary disease. Electronically Signed   By: Misty Stanley M.D.   On: 01/27/2017 08:41    Procedures Procedures (including critical care time)  Medications Ordered in ED Medications  benzonatate (  TESSALON) capsule 200 mg (200 mg Oral Given 01/27/17 0918)  ondansetron (ZOFRAN-ODT) disintegrating tablet 8 mg (8 mg Oral Given 01/27/17 0919)  acetaminophen (TYLENOL) tablet 650 mg (650 mg Oral Given 01/27/17 0034)     Initial Impression / Assessment and Plan / ED Course  I have reviewed the triage vital signs and the nursing notes.  Pertinent labs & imaging results that were available during my care of the patient were reviewed by me and considered in my medical decision making (see chart for details).     Patient presents to ED for evaluation of chest pain, productive cough, vomiting and diarrhea as well as generalized abdominal pain. He states that all symptoms began about 3 days ago but have somewhat improved today. On physical exam there is no increased work of breathing, no signs of respiratory distress. He does have  generalized abdominal tenderness noted. There are no signs of PTA or RPA on HEENT exam, and I have low suspicion that this could be the cause of his sore throat. He is not tachycardic or tachypneic. Oxygen saturations 100% on room air. He is afebrile with no history of fever. CBC, CMP unremarkable. Lipase normal. Rapid strep screen negative. X-ray negative. EKG showed tracing similar to previous. Troponin negative 1. Will obtain a delta troponin. He is PERC negative so no need for further workup for PE at this time.  1220: Delta troponin returned as negative. I have low suspicion for a cardiac cause being the cause of his chest pain. Patient reports improvement in symptoms with Tessalon, Tylenol and Zofran given here in the ED. I reassured him of his negative workup and encouraged him to take Tessalon and Zofran at home as needed. I encouraged him to continue fluid and food intake as directed for increased energy. Patient appears stable for discharge at this time. Strict return precautions given.  Final Clinical Impressions(s) / ED Diagnoses   Final diagnoses:  Cough  Upper respiratory tract infection, unspecified type  Nausea vomiting and diarrhea    New Prescriptions Discharge Medication List as of 01/27/2017 12:20 PM    START taking these medications   Details  benzonatate (TESSALON) 100 MG capsule Take 1 capsule (100 mg total) by mouth every 8 (eight) hours., Starting Tue 01/27/2017, Print    ondansetron (ZOFRAN ODT) 4 MG disintegrating tablet Take 1 tablet (4 mg total) by mouth every 8 (eight) hours as needed for nausea or vomiting., Starting Tue 01/27/2017, Print         Shelly Coss, [place], PA-C 01/27/17 1229    Dorie Rank, MD 01/30/17 1021

## 2017-01-27 NOTE — Discharge Instructions (Signed)
Please read attached information regarding your condition. Take tessalon perles and Zofran as needed. Follow up with PCP for further evaluation if symptoms persist. Return to ED for worsening chest pain, trouble breathing, trouble swallowing, coughing up blood, leg swelling, loss of consciousness.

## 2017-01-27 NOTE — ED Triage Notes (Signed)
Pt states that 2 days ago, he began having lt sided chest pain.  States that he started having a productive cough with tan phlegm.  Pt states that his throat is "scratchy" and he feels cold.  Denies NVD.

## 2017-01-29 LAB — CULTURE, GROUP A STREP (THRC)

## 2017-07-05 ENCOUNTER — Encounter (HOSPITAL_BASED_OUTPATIENT_CLINIC_OR_DEPARTMENT_OTHER): Payer: Self-pay | Admitting: Emergency Medicine

## 2017-07-05 ENCOUNTER — Other Ambulatory Visit: Payer: Self-pay

## 2017-07-05 ENCOUNTER — Emergency Department (HOSPITAL_BASED_OUTPATIENT_CLINIC_OR_DEPARTMENT_OTHER)
Admission: EM | Admit: 2017-07-05 | Discharge: 2017-07-05 | Disposition: A | Discharge status: Home / Self Care | Payer: BLUE CROSS/BLUE SHIELD | Attending: Emergency Medicine | Admitting: Emergency Medicine

## 2017-07-05 ENCOUNTER — Emergency Department (HOSPITAL_BASED_OUTPATIENT_CLINIC_OR_DEPARTMENT_OTHER): Payer: BLUE CROSS/BLUE SHIELD

## 2017-07-05 DIAGNOSIS — Z79899 Other long term (current) drug therapy: Secondary | ICD-10-CM | POA: Diagnosis not present

## 2017-07-05 DIAGNOSIS — R079 Chest pain, unspecified: Secondary | ICD-10-CM | POA: Diagnosis not present

## 2017-07-05 DIAGNOSIS — F17210 Nicotine dependence, cigarettes, uncomplicated: Secondary | ICD-10-CM | POA: Diagnosis not present

## 2017-07-05 DIAGNOSIS — R0789 Other chest pain: Secondary | ICD-10-CM

## 2017-07-05 LAB — CBC WITH DIFFERENTIAL/PLATELET
Basophils Absolute: 0 10*3/uL (ref 0.0–0.1)
Basophils Relative: 0 %
Eosinophils Absolute: 0.5 10*3/uL (ref 0.0–0.7)
Eosinophils Relative: 8 %
HCT: 36.1 % — ABNORMAL LOW (ref 39.0–52.0)
Hemoglobin: 12.8 g/dL — ABNORMAL LOW (ref 13.0–17.0)
Lymphocytes Relative: 23 %
Lymphs Abs: 1.5 10*3/uL (ref 0.7–4.0)
MCH: 29.2 pg (ref 26.0–34.0)
MCHC: 35.5 g/dL (ref 30.0–36.0)
MCV: 82.2 fL (ref 78.0–100.0)
Monocytes Absolute: 0.6 10*3/uL (ref 0.1–1.0)
Monocytes Relative: 10 %
Neutro Abs: 3.8 10*3/uL (ref 1.7–7.7)
Neutrophils Relative %: 59 %
Platelets: 146 10*3/uL — ABNORMAL LOW (ref 150–400)
RBC: 4.39 MIL/uL (ref 4.22–5.81)
RDW: 14.5 % (ref 11.5–15.5)
WBC: 6.4 10*3/uL (ref 4.0–10.5)

## 2017-07-05 LAB — TROPONIN I
Troponin I: <0.03 ng/mL (ref <0.03)
Troponin I: <0.03 ng/mL (ref <0.03)

## 2017-07-05 LAB — COMPREHENSIVE METABOLIC PANEL
ALT: 16 U/L — ABNORMAL LOW (ref 17–63)
AST: 20 U/L (ref 15–41)
Albumin: 3.9 g/dL (ref 3.5–5.0)
Alkaline Phosphatase: 37 U/L — ABNORMAL LOW (ref 38–126)
Anion gap: 6 (ref 5–15)
BUN: 12 mg/dL (ref 6–20)
CO2: 29 mmol/L (ref 22–32)
Calcium: 9.4 mg/dL (ref 8.9–10.3)
Chloride: 105 mmol/L (ref 101–111)
Creatinine, Ser: 1 mg/dL (ref 0.61–1.24)
GFR calc Af Amer: >60 mL/min (ref >60)
GFR calc non Af Amer: >60 mL/min (ref >60)
Glucose, Bld: 90 mg/dL (ref 65–99)
Potassium: 4.3 mmol/L (ref 3.5–5.1)
Sodium: 140 mmol/L (ref 135–145)
Total Bilirubin: 0.3 mg/dL (ref 0.3–1.2)
Total Protein: 7.3 g/dL (ref 6.5–8.1)

## 2017-07-05 LAB — LIPASE, BLOOD: Lipase: 45 U/L (ref 11–51)

## 2017-07-05 MED ORDER — NAPROXEN 500 MG PO TABS: 500.0000 mg | ORAL_TABLET | Freq: Two times a day (BID) | ORAL | 0 refills | Status: DC

## 2017-07-05 MED ORDER — BENZONATATE 100 MG PO CAPS: 100.0000 mg | ORAL_CAPSULE | Freq: Three times a day (TID) | ORAL | 0 refills | Status: DC

## 2017-07-05 MED ORDER — KETOROLAC TROMETHAMINE 30 MG/ML IJ SOLN
30.0000 mg | Freq: Once | INTRAMUSCULAR | Status: AC
  Administered 2017-07-05: 30 mg via INTRAMUSCULAR
  Filled 2017-07-05: qty 1

## 2017-07-05 NOTE — ED Notes (Signed)
Pt aaox4 no pain at this time trop drawn

## 2017-07-05 NOTE — ED Notes (Signed)
ED Provider at bedside. 

## 2017-07-05 NOTE — ED Provider Notes (Signed)
[facility] Provider Note   [name]: [id number] Arrival date & time: 07/05/17  3532     History   Chief Complaint Chief Complaint  Patient presents with  . Chest Pain    HPI [facility] is a 37 y.o. male who presents to ED for evaluation of right sided intermittent, pressure-like chest pain that began yesterday.  Pain radiates throughout the entire chest.  It is also located in the epigastric area.  Pain does not radiate.  It is worse with movement and palpation.  Also reports associated intermittent shortness of breath, as well as a dry cough.  His young daughter has similar URI symptoms for the past several days.  He took 1 dose of Tylenol yesterday with no improvement in his pain.  No previous history of similar symptoms in the past.  He denies any wheezing, hemoptysis, leg swelling, recent prolonged travel, recent surgeries, prior MI, DVT or PE, nausea, vomiting, bowel changes, fevers. He reports occasional alcohol use, daily tobacco use and daily marijuana use.  HPI  Past Medical History:  Diagnosis Date  . Migraine     Patient Active Problem List   Diagnosis Date Noted  . Right leg pain 01/02/2014    Past Surgical History:  Procedure Laterality Date  . DENTAL SURGERY         Home Medications    Prior to Admission medications   Medication Sig Start Date End Date Taking? Authorizing Provider  azithromycin (ZITHROMAX Z-PAK) 250 MG tablet Take 1 tablet (250 mg total) by mouth daily. 500mg  PO day 1, then 250mg  PO days 2-5 Patient not taking: Reported on 01/27/2017 06/05/15   Little, [name], MD  benzonatate (TESSALON) 100 MG capsule Take 1 capsule (100 mg total) by mouth every 8 (eight) hours. 07/05/17   Kia Varnadore, PA-C  dextromethorphan-guaiFENesin (MUCINEX DM) 30-600 MG 12hr tablet Take 1 tablet by mouth 2 (two) times daily as needed for cough.    [provider]  ibuprofen (ADVIL,MOTRIN) 200 MG tablet Take 400-600 mg  by mouth every 6 (six) hours as needed.    [provider]  Multiple Vitamins-Minerals (AIRBORNE PO) Take 1 packet by mouth 3 (three) times daily as needed (cold symptoms).    [provider]  naproxen (NAPROSYN) 500 MG tablet Take 1 tablet (500 mg total) by mouth 2 (two) times daily. 07/05/17   Danique Hartsough, PA-C  ondansetron (ZOFRAN ODT) 4 MG disintegrating tablet Take 1 tablet (4 mg total) by mouth every 8 (eight) hours as needed for nausea or vomiting. 01/27/17   Delia Heady, PA-C    Family History No family history on file.  Social History Social History   Tobacco Use  . Smoking status: Current Every Day Smoker    Packs/day: 0.25    Types: Cigarettes  . Smokeless tobacco: Never Used  Substance Use Topics  . Alcohol use: Yes    Comment: socially  . Drug use: Yes    Types: Marijuana     Allergies   Patient has no known allergies.   Review of Systems Review of Systems  Constitutional: Negative for appetite change, chills and fever.  HENT: Negative for ear pain, rhinorrhea, sneezing and sore throat.   Eyes: Negative for photophobia and visual disturbance.  Respiratory: Positive for cough and shortness of breath. Negative for chest tightness and wheezing.   Cardiovascular: Positive for chest pain. Negative for palpitations.  Gastrointestinal: Negative for abdominal pain, blood in stool, constipation, diarrhea, nausea and  vomiting.  Genitourinary: Negative for dysuria, hematuria and urgency.  Musculoskeletal: Negative for myalgias.  Skin: Negative for rash.  Neurological: Negative for dizziness, weakness and light-headedness.     Physical Exam Updated Vital Signs BP (!) 113/94   Pulse (!) 49   Temp 97.9 F (36.6 C) (Oral)   Resp 20   Ht 6\' 3"  (1.905 m)   Wt 95.3 kg (210 lb)   SpO2 100%   BMI 26.25 kg/m   Physical Exam  Constitutional: He appears well-developed and well-nourished. No distress.  Nontoxic appearing and in no acute distress.    HENT:  Head: Normocephalic and atraumatic.  Nose: Nose normal.  Eyes: Conjunctivae and EOM are normal. Right eye exhibits no discharge. Left eye exhibits no discharge. No scleral icterus.  Neck: Normal range of motion. Neck supple.  Cardiovascular: Normal rate, regular rhythm, normal heart sounds and intact distal pulses. Exam reveals no gallop and no friction rub.  No murmur heard. Pulmonary/Chest: Effort normal and breath sounds normal. No respiratory distress. He exhibits tenderness.    Abdominal: Soft. Bowel sounds are normal. He exhibits no distension. There is no tenderness. There is no guarding.  Musculoskeletal: Normal range of motion. He exhibits no edema.  No bilateral lower extremity edema, erythema or calf tenderness.  Neurological: He is alert. He exhibits normal muscle tone. Coordination normal.  Skin: Skin is warm and dry. No rash noted.  Psychiatric: He has a normal mood and affect.  Nursing note and vitals reviewed.    ED Treatments / Results  Labs (all labs ordered are listed, but only abnormal results are displayed) Labs Reviewed  COMPREHENSIVE METABOLIC PANEL - Abnormal; Notable for the following components:      Result Value   ALT 16 (*)    Alkaline Phosphatase 37 (*)    All other components within normal limits  CBC WITH DIFFERENTIAL/PLATELET - Abnormal; Notable for the following components:   Hemoglobin 12.8 (*)    HCT 36.1 (*)    Platelets 146 (*)    All other components within normal limits  LIPASE, BLOOD  TROPONIN I  TROPONIN I    EKG  EKG Interpretation  Date/Time:  Sunday July 05 2017 10:02:10 EST Ventricular Rate:  65 PR Interval:    QRS Duration: 84 QT Interval:  375 QTC Calculation: 390 R Axis:   81 Text Interpretation:  Sinus rhythm Consider left ventricular hypertrophy no significant change since Aug 2018 Confirmed by Sherwood Gambler [phone]) on 07/05/2017 10:05:54 AM Also confirmed by Sherwood Gambler [phone]), editor Laurena Spies [phone])  on 07/05/2017 12:11:27 PM       Radiology Dg Chest 2 View  Result Date: 07/05/2017 CLINICAL DATA:  Anterior chest pain for 3 days. EXAM: CHEST  2 VIEW COMPARISON:  01/27/2017 FINDINGS: The heart size and mediastinal contours are within normal limits. Both lungs are clear. The visualized skeletal structures are unremarkable. IMPRESSION: No active cardiopulmonary disease. Electronically Signed   By: Markus Daft M.D.   On: 07/05/2017 10:21    Procedures Procedures (including critical care time)  Medications Ordered in ED Medications  ketorolac (TORADOL) 30 MG/ML injection 30 mg (30 mg Intramuscular Given 07/05/17 1101)     Initial Impression / Assessment and Plan / ED Course  I have reviewed the triage vital signs and the nursing notes.  Pertinent labs & imaging results that were available during my care of the patient were reviewed by me and considered in my medical decision making (see chart for details).  Patient, with no significant past medical history, who presents to ED for evaluation of right-sided intermittent, pressure-like chest pain that began yesterday.  The pain does not radiate but is also located in the epigastric area.  He reports associated shortness of breath as well as a dry cough.  On physical exam patient is overall well-appearing.  He is not tachycardic or tachypneic.  He is satting at 100% on room air.  He does have tenderness to palpation of the right side of the chest as indicated in the image above.  He reports daily tobacco and marijuana use.  He denies any previous history of MI, DVT or PE.  He is PERC negative.  EKG with no significant changes from previous tracings.  Troponin, CBC, BMP unremarkable.  Patient is low risk by HEART score.  Does report return as negative as well.  Chest x-ray was negative.  Suspect that his symptoms are due to chest wall pain.  He reports much improvement in his symptoms with Toradol given here in the ED.  Will give  naproxen to take at home as well as Tessalon Perles to help with cough.  I have low suspicion for severe cardiac or pulmonary cause of his symptoms based on today's negative workup, physical exam findings and history.  Advised to follow-up at [facility] for further evaluation.  Patient appears stable for discharge at this time.  Strict return precautions given.  Final Clinical Impressions(s) / ED Diagnoses   Final diagnoses:  Chest wall pain    ED Discharge Orders        Ordered    naproxen (NAPROSYN) 500 MG tablet  2 times daily     07/05/17 1502    benzonatate (TESSALON) 100 MG capsule  Every 8 hours     07/05/17 1502     Portions of this note were generated with [employer]. Dictation errors may occur despite best attempts at proofreading.    Delia Heady, PA-C 07/05/17 [place], MD 07/05/17 [phone]

## 2017-07-05 NOTE — Discharge Instructions (Signed)
Please read attached information regarding your condition. Take Tessalon Perles as needed for cough. Take naproxen as needed for your chest wall pain and inflammation. Follow-up at [facility] return to ED for worsening chest pain, trouble breathing, coughing up blood, severe abdominal pain.

## 2017-07-05 NOTE — ED Triage Notes (Signed)
Pt reports chest pain since yesterday. Pt worse with movement. Denies N/V

## 2018-03-10 ENCOUNTER — Emergency Department (HOSPITAL_COMMUNITY)
Admission: EM | Admit: 2018-03-10 | Discharge: 2018-03-10 | Disposition: A | Discharge status: Home / Self Care | Payer: BLUE CROSS/BLUE SHIELD | Attending: Emergency Medicine | Admitting: Emergency Medicine

## 2018-03-10 ENCOUNTER — Encounter (HOSPITAL_COMMUNITY): Payer: Self-pay | Admitting: Emergency Medicine

## 2018-03-10 ENCOUNTER — Emergency Department (HOSPITAL_COMMUNITY): Payer: BLUE CROSS/BLUE SHIELD

## 2018-03-10 DIAGNOSIS — M79621 Pain in right upper arm: Secondary | ICD-10-CM | POA: Diagnosis not present

## 2018-03-10 DIAGNOSIS — Z79899 Other long term (current) drug therapy: Secondary | ICD-10-CM | POA: Diagnosis not present

## 2018-03-10 DIAGNOSIS — M25511 Pain in right shoulder: Secondary | ICD-10-CM | POA: Diagnosis not present

## 2018-03-10 DIAGNOSIS — M25551 Pain in right hip: Secondary | ICD-10-CM | POA: Diagnosis not present

## 2018-03-10 DIAGNOSIS — L739 Follicular disorder, unspecified: Secondary | ICD-10-CM | POA: Diagnosis not present

## 2018-03-10 DIAGNOSIS — F17210 Nicotine dependence, cigarettes, uncomplicated: Secondary | ICD-10-CM | POA: Diagnosis not present

## 2018-03-10 MED ORDER — PREDNISONE 20 MG PO TABS: 40.0000 mg | ORAL_TABLET | Freq: Every day | ORAL | 0 refills | Status: DC

## 2018-03-10 MED ORDER — PREDNISONE 20 MG PO TABS
60.0000 mg | ORAL_TABLET | ORAL | Status: AC
  Administered 2018-03-10: 60 mg via ORAL
  Filled 2018-03-10: qty 3

## 2018-03-10 NOTE — ED Triage Notes (Signed)
Patient here from home with complaints of right shoulder and right hip pain since last week. Denies trauma. "I think I slept on it wrong".

## 2018-03-10 NOTE — Discharge Instructions (Signed)
As discussed, your pain in the right side, right hip are likely due to inflammation. Please use the provided steroids for the next 4 days after today's dose provided here.  Please consider physical therapy for additional long-term improvement.  In regards to your lower extremity skin lesions, please try Funga Soap  Return here for any concerning changes in your condition.

## 2018-03-10 NOTE — ED Provider Notes (Signed)
[facility] DEPT Provider Note   [name]: [id number] Arrival date & time: 03/10/18  6144     History   Chief Complaint Chief Complaint  Patient presents with  . Hip Pain  . Shoulder Pain    HPI Ronald Jackson is a 37 y.o. male.  HPI Patient presents with concern of pain in the right axilla, right hip, and skin lesions in the lower extremities. Skin lesions are discussed in the course of review of systems discussion. Patient presents primarily due to the aforementioned pain in the right axilla, right hip. He notes that the pain began 4 days ago, was transiently better with ibuprofen, but has not improved in spite of using that medication.  The pain is sharp, severe, focally in the right mid axilla, and right anterior lateral hip. Pain is worse with activity, ambulation in regards to the hip, shoulder motion in regards to the axilla. No dyspnea, no fever, no cough. Patient with hip pain has flared previously, but had been reasonably under control to the past few days.  Past Medical History:  Diagnosis Date  . Migraine     Patient Active Problem List   Diagnosis Date Noted  . Right leg pain 01/02/2014    Past Surgical History:  Procedure Laterality Date  . DENTAL SURGERY          Home Medications    Prior to Admission medications   Medication Sig Start Date End Date Taking? Authorizing Provider  azithromycin (ZITHROMAX Z-PAK) 250 MG tablet Take 1 tablet (250 mg total) by mouth daily. 500mg  PO day 1, then 250mg  PO days 2-5 Patient not taking: Reported on 01/27/2017 06/05/15   Little, [name], MD  benzonatate (TESSALON) 100 MG capsule Take 1 capsule (100 mg total) by mouth every 8 (eight) hours. 07/05/17   Khatri, Hina, PA-C  dextromethorphan-guaiFENesin (MUCINEX DM) 30-600 MG 12hr tablet Take 1 tablet by mouth 2 (two) times daily as needed for cough.    [provider]  ibuprofen (ADVIL,MOTRIN) 200 MG tablet Take 400-600 mg by  mouth every 6 (six) hours as needed.    [provider]  Multiple Vitamins-Minerals (AIRBORNE PO) Take 1 packet by mouth 3 (three) times daily as needed (cold symptoms).    [provider]  naproxen (NAPROSYN) 500 MG tablet Take 1 tablet (500 mg total) by mouth 2 (two) times daily. 07/05/17   Khatri, Hina, PA-C  ondansetron (ZOFRAN ODT) 4 MG disintegrating tablet Take 1 tablet (4 mg total) by mouth every 8 (eight) hours as needed for nausea or vomiting. 01/27/17   Khatri, Hina, PA-C  predniSONE (DELTASONE) 20 MG tablet Take 2 tablets (40 mg total) by mouth daily with breakfast. For the next four days 03/10/18   Carmin Muskrat, MD    Family History No family history on file.  Social History Social History   Tobacco Use  . Smoking status: Current Every Day Smoker    Packs/day: 0.25    Types: Cigarettes  . Smokeless tobacco: Never Used  Substance Use Topics  . Alcohol use: Yes    Comment: socially  . Drug use: Yes    Types: Marijuana     Allergies   Patient has no known allergies.   Review of Systems Review of Systems  Constitutional:       Per HPI, otherwise negative  HENT:       Per HPI, otherwise negative  Respiratory:       Per HPI, otherwise negative  Cardiovascular:       Per HPI, otherwise negative  Gastrointestinal: Negative for vomiting.  Endocrine:       Negative aside from HPI  Genitourinary:       Neg aside from HPI   Musculoskeletal:       Per HPI, otherwise negative  Skin: Positive for color change and wound.  Neurological: Negative for syncope.     Physical Exam Updated Vital Signs BP (!) 134/94 (BP Location: Right Arm)   Pulse 61   Temp 98 F (36.7 C) (Oral)   Resp 16   SpO2 100%   Physical Exam  Constitutional: He is oriented to person, place, and time. He appears well-developed. No distress.  HENT:  Head: Normocephalic and atraumatic.  Eyes: Conjunctivae and EOM are normal.  Cardiovascular: Normal rate and regular  rhythm.  Pulmonary/Chest: Effort normal. No stridor. No respiratory distress.  Abdominal: He exhibits no distension.  Musculoskeletal: He exhibits no edema.       Right shoulder: Normal.       Right hip: He exhibits tenderness and bony tenderness.       Left hip: Normal.       Arms:      Legs: Neurological: He is alert and oriented to person, place, and time.  Skin: Skin is warm and dry.     Psychiatric: He has a normal mood and affect.  Nursing note and vitals reviewed.    ED Treatments / Results   Procedures Procedures (including critical care time)  Medications Ordered in ED Medications  predniSONE (DELTASONE) tablet 60 mg (has no administration in time range)     Initial Impression / Assessment and Plan / ED Course  I have reviewed the triage vital signs and the nursing notes.  Pertinent labs & imaging results that were available during my care of the patient were reviewed by me and considered in my medical decision making (see chart for details).   Elderly well-appearing male presents with pain in multiple areas, as well as skin lesions. Patient is awake, alert, with no evidence for fracture on exam, suspicion for musculoskeletal pain, inflammation peer Patient has not had improvement with ibuprofen, was encouraged to use rest, short course of steroids, physical therapy. Patient was found to have skin lesions concerning for smoldering folliculitis, no evidence for cellulitis.  Patient started on a course of topical therapy.   Final Clinical Impressions(s) / ED Diagnoses   Final diagnoses:  Hip pain, acute, right  Axillary pain, right  Folliculitis    ED Discharge Orders         Ordered    predniSONE (DELTASONE) 20 MG tablet  Daily with breakfast     03/10/18 0823           Carmin Muskrat, MD 03/10/18 [phone]

## 2018-03-11 DIAGNOSIS — M25551 Pain in right hip: Secondary | ICD-10-CM | POA: Diagnosis not present

## 2018-03-11 DIAGNOSIS — M25511 Pain in right shoulder: Secondary | ICD-10-CM | POA: Diagnosis not present

## 2018-03-12 DIAGNOSIS — M5386 Other specified dorsopathies, lumbar region: Secondary | ICD-10-CM | POA: Diagnosis not present

## 2018-03-12 DIAGNOSIS — M5414 Radiculopathy, thoracic region: Secondary | ICD-10-CM | POA: Diagnosis not present

## 2018-03-12 DIAGNOSIS — M9902 Segmental and somatic dysfunction of thoracic region: Secondary | ICD-10-CM | POA: Diagnosis not present

## 2018-03-12 DIAGNOSIS — M9901 Segmental and somatic dysfunction of cervical region: Secondary | ICD-10-CM | POA: Diagnosis not present

## 2018-08-04 ENCOUNTER — Emergency Department (HOSPITAL_BASED_OUTPATIENT_CLINIC_OR_DEPARTMENT_OTHER): Payer: BLUE CROSS/BLUE SHIELD

## 2018-08-04 ENCOUNTER — Emergency Department (HOSPITAL_COMMUNITY)
Admission: EM | Admit: 2018-08-04 | Discharge: 2018-08-04 | Discharge status: Absent without leave | Payer: BLUE CROSS/BLUE SHIELD

## 2018-08-04 ENCOUNTER — Other Ambulatory Visit: Payer: Self-pay

## 2018-08-04 ENCOUNTER — Emergency Department (HOSPITAL_BASED_OUTPATIENT_CLINIC_OR_DEPARTMENT_OTHER)
Admission: EM | Admit: 2018-08-04 | Discharge: 2018-08-04 | Disposition: A | Discharge status: Home / Self Care | Payer: BLUE CROSS/BLUE SHIELD | Attending: Emergency Medicine | Admitting: Emergency Medicine

## 2018-08-04 ENCOUNTER — Encounter (HOSPITAL_BASED_OUTPATIENT_CLINIC_OR_DEPARTMENT_OTHER): Payer: Self-pay | Admitting: Emergency Medicine

## 2018-08-04 DIAGNOSIS — R1032 Left lower quadrant pain: Secondary | ICD-10-CM | POA: Diagnosis not present

## 2018-08-04 DIAGNOSIS — R112 Nausea with vomiting, unspecified: Secondary | ICD-10-CM

## 2018-08-04 DIAGNOSIS — Z79899 Other long term (current) drug therapy: Secondary | ICD-10-CM | POA: Diagnosis not present

## 2018-08-04 DIAGNOSIS — F17210 Nicotine dependence, cigarettes, uncomplicated: Secondary | ICD-10-CM | POA: Diagnosis not present

## 2018-08-04 LAB — CBC WITH DIFFERENTIAL/PLATELET
Abs Immature Granulocytes: 0.02 10*3/uL (ref 0.00–0.07)
Basophils Absolute: 0 10*3/uL (ref 0.0–0.1)
Basophils Relative: 1 %
Eosinophils Absolute: 0.3 10*3/uL (ref 0.0–0.5)
Eosinophils Relative: 6 %
HCT: 41.3 % (ref 39.0–52.0)
Hemoglobin: 13.8 g/dL (ref 13.0–17.0)
Immature Granulocytes: 0 %
Lymphocytes Relative: 29 %
Lymphs Abs: 1.6 10*3/uL (ref 0.7–4.0)
MCH: 28.3 pg (ref 26.0–34.0)
MCHC: 33.4 g/dL (ref 30.0–36.0)
MCV: 84.8 fL (ref 80.0–100.0)
Monocytes Absolute: 0.5 10*3/uL (ref 0.1–1.0)
Monocytes Relative: 9 %
Neutro Abs: 2.9 10*3/uL (ref 1.7–7.7)
Neutrophils Relative %: 55 %
Platelets: 179 10*3/uL (ref 150–400)
RBC: 4.87 MIL/uL (ref 4.22–5.81)
RDW: 14.6 % (ref 11.5–15.5)
WBC Morphology: ABNORMAL
WBC: 5.3 10*3/uL (ref 4.0–10.5)
nRBC: 0 % (ref 0.0–0.2)

## 2018-08-04 LAB — BASIC METABOLIC PANEL
Anion gap: 6 (ref 5–15)
BUN: 11 mg/dL (ref 6–20)
CO2: 27 mmol/L (ref 22–32)
Calcium: 9.4 mg/dL (ref 8.9–10.3)
Chloride: 103 mmol/L (ref 98–111)
Creatinine, Ser: 1.15 mg/dL (ref 0.61–1.24)
GFR calc Af Amer: >60 mL/min (ref >60)
GFR calc non Af Amer: >60 mL/min (ref >60)
Glucose, Bld: 97 mg/dL (ref 70–99)
Potassium: 3.3 mmol/L — ABNORMAL LOW (ref 3.5–5.1)
Sodium: 136 mmol/L (ref 135–145)

## 2018-08-04 LAB — URINALYSIS, ROUTINE W REFLEX MICROSCOPIC
Bilirubin Urine: NEGATIVE
Glucose, UA: NEGATIVE mg/dL
Hgb urine dipstick: NEGATIVE
Ketones, ur: NEGATIVE mg/dL
Leukocytes,Ua: NEGATIVE
Nitrite: NEGATIVE
Protein, ur: NEGATIVE mg/dL
Specific Gravity, Urine: 1.015 (ref 1.005–1.030)
pH: 7 (ref 5.0–8.0)

## 2018-08-04 MED ORDER — FENTANYL CITRATE (PF) 100 MCG/2ML IJ SOLN
100.0000 ug | Freq: Once | INTRAMUSCULAR | Status: AC
Start: 2018-08-04 — End: 2018-08-04
  Administered 2018-08-04: 100 ug via INTRAVENOUS
  Filled 2018-08-04: qty 2

## 2018-08-04 MED ORDER — IOPAMIDOL (ISOVUE-300) INJECTION 61%
100.0000 mL | Freq: Once | INTRAVENOUS | Status: AC | PRN
  Administered 2018-08-04: 100 mL via INTRAVENOUS

## 2018-08-04 MED ORDER — ONDANSETRON HCL 4 MG/2ML IJ SOLN
4.0000 mg | Freq: Once | INTRAMUSCULAR | Status: AC
  Administered 2018-08-04: 4 mg via INTRAVENOUS
  Filled 2018-08-04: qty 2

## 2018-08-04 NOTE — ED Triage Notes (Signed)
Pt is c/o left side abd pain  Pt states the pain started about 1-2 weeks ago  Pt states the pain became more intense around 4 this morning  Pt has had nausea and vomiting with the pain  Last BM was this morning around 0415

## 2018-08-04 NOTE — ED Provider Notes (Addendum)
[facility] Provider Note: Georgena Spurling, MD, FACEP  [name]: [id number] MRN: [id number] ARRIVAL: 08/04/18 at [place]: [place]  Abdominal Pain   HISTORY OF PRESENT ILLNESS  08/04/18 5:17 AM Ronald Jackson is a 38 y.o. male with a 1 to 2-week history of intermittent left lower quadrant pain.  The pain had not been severe until 4 AM this morning when it woke him up.  He rates it as a 7 out of 10 presently.  It is worse with movement or palpation.  The pain is well localized and sharp.  It is been associated with nausea and vomiting but no diarrhea or constipation.  He has not had a fever.  He had a bowel movement this morning about 4:15 AM.  He denies dysuria or hematuria.   Past Medical History:  Diagnosis Date  . Migraine     Past Surgical History:  Procedure Laterality Date  . DENTAL SURGERY      Family History  Problem Relation Age of Onset  . Diabetes Other     Social History   Tobacco Use  . Smoking status: Current Every Day Smoker    Packs/day: 0.25    Types: Cigarettes  . Smokeless tobacco: Never Used  Substance Use Topics  . Alcohol use: Yes    Comment: socially  . Drug use: Yes    Types: Marijuana    Prior to Admission medications   Medication Sig Start Date End Date Taking? Authorizing Provider  azithromycin (ZITHROMAX Z-PAK) 250 MG tablet Take 1 tablet (250 mg total) by mouth daily. 500mg  PO day 1, then 250mg  PO days 2-5 Patient not taking: Reported on 01/27/2017 06/05/15   Little, [name], MD  benzonatate (TESSALON) 100 MG capsule Take 1 capsule (100 mg total) by mouth every 8 (eight) hours. 07/05/17   Khatri, Hina, PA-C  dextromethorphan-guaiFENesin (MUCINEX DM) 30-600 MG 12hr tablet Take 1 tablet by mouth 2 (two) times daily as needed for cough.    [provider]  ibuprofen (ADVIL,MOTRIN) 200 MG tablet Take 400-600 mg by mouth every 6 (six) hours as needed.    [provider]  Multiple  Vitamins-Minerals (AIRBORNE PO) Take 1 packet by mouth 3 (three) times daily as needed (cold symptoms).    [provider]  naproxen (NAPROSYN) 500 MG tablet Take 1 tablet (500 mg total) by mouth 2 (two) times daily. 07/05/17   Khatri, Hina, PA-C  ondansetron (ZOFRAN ODT) 4 MG disintegrating tablet Take 1 tablet (4 mg total) by mouth every 8 (eight) hours as needed for nausea or vomiting. 01/27/17   Khatri, Hina, PA-C  predniSONE (DELTASONE) 20 MG tablet Take 2 tablets (40 mg total) by mouth daily with breakfast. For the next four days 03/10/18   Carmin Muskrat, MD    Allergies Patient has no known allergies.   REVIEW OF SYSTEMS  Negative except as noted here or in the History of Present Illness.   PHYSICAL EXAMINATION  Initial Vital Signs Blood pressure (!) 130/97, pulse 94, temperature 98.2 F (36.8 C), temperature source Oral, resp. rate 20, height 6\' 3"  (1.905 m), weight 95.3 kg, SpO2 99 %.  Examination General: Well-developed, well-nourished male in no acute distress; appearance consistent with age of record HENT: normocephalic; atraumatic Eyes: pupils equal, round and reactive to light; extraocular muscles intact Neck: supple Heart: regular rate and rhythm Lungs: clear to auscultation bilaterally Abdomen: soft; nondistended; left lower quadrant tenderness; no masses or hepatosplenomegaly; bowel sounds present  Extremities: No deformity; full range of motion; pulses normal Neurologic: Awake, alert and oriented; motor function intact in all extremities and symmetric; no facial droop Skin: Warm and dry Psychiatric: Flat affect   RESULTS  Summary of this visit's results, reviewed by myself:   EKG Interpretation  Date/Time:    Ventricular Rate:    PR Interval:    QRS Duration:   QT Interval:    QTC Calculation:   R Axis:     Text Interpretation:        Laboratory Studies: Results for orders placed or performed during the hospital encounter of 08/04/18 (from  the past 24 hour(s))  CBC with Differential/Platelet     Status: None   Collection Time: 08/04/18  5:27 AM  Result Value Ref Range   WBC 5.3 4.0 - 10.5 K/uL   RBC 4.87 4.22 - 5.81 MIL/uL   Hemoglobin 13.8 13.0 - 17.0 g/dL   HCT 41.3 39.0 - 52.0 %   MCV 84.8 80.0 - 100.0 fL   MCH 28.3 26.0 - 34.0 pg   MCHC 33.4 30.0 - 36.0 g/dL   RDW 14.6 11.5 - 15.5 %   Platelets 179 150 - 400 K/uL   nRBC 0.0 0.0 - 0.2 %   Neutrophils Relative % 55 %   Neutro Abs 2.9 1.7 - 7.7 K/uL   Lymphocytes Relative 29 %   Lymphs Abs 1.6 0.7 - 4.0 K/uL   Monocytes Relative 9 %   Monocytes Absolute 0.5 0.1 - 1.0 K/uL   Eosinophils Relative 6 %   Eosinophils Absolute 0.3 0.0 - 0.5 K/uL   Basophils Relative 1 %   Basophils Absolute 0.0 0.0 - 0.1 K/uL   WBC Morphology Abnormal lymphocytes present    RBC Morphology MORPHOLOGY UNREMARKABLE    Smear Review MORPHOLOGY UNREMARKABLE    Immature Granulocytes 0 %   Abs Immature Granulocytes 0.02 0.00 - 0.07 K/uL  Basic metabolic panel     Status: Abnormal   Collection Time: 08/04/18  5:27 AM  Result Value Ref Range   Sodium 136 135 - 145 mmol/L   Potassium 3.3 (L) 3.5 - 5.1 mmol/L   Chloride 103 98 - 111 mmol/L   CO2 27 22 - 32 mmol/L   Glucose, Bld 97 70 - 99 mg/dL   BUN 11 6 - 20 mg/dL   Creatinine, Ser 1.15 0.61 - 1.24 mg/dL   Calcium 9.4 8.9 - 10.3 mg/dL   GFR calc non Af Amer >60 >60 mL/min   GFR calc Af Amer >60 >60 mL/min   Anion gap 6 5 - 15  Urinalysis, Routine w reflex microscopic     Status: None   Collection Time: 08/04/18  5:27 AM  Result Value Ref Range   Color, Urine YELLOW YELLOW   APPearance CLEAR CLEAR   Specific Gravity, Urine 1.015 1.005 - 1.030   pH 7.0 5.0 - 8.0   Glucose, UA NEGATIVE NEGATIVE mg/dL   Hgb urine dipstick NEGATIVE NEGATIVE   Bilirubin Urine NEGATIVE NEGATIVE   Ketones, ur NEGATIVE NEGATIVE mg/dL   Protein, ur NEGATIVE NEGATIVE mg/dL   Nitrite NEGATIVE NEGATIVE   Leukocytes,Ua NEGATIVE NEGATIVE   Imaging  Studies: Ct Abdomen Pelvis W Contrast  Result Date: 08/04/2018 CLINICAL DATA:  Left lower quadrant pain for 2 weeks EXAM: CT ABDOMEN AND PELVIS WITH CONTRAST TECHNIQUE: Multidetector CT imaging of the abdomen and pelvis was performed using the standard protocol following bolus administration of intravenous contrast. CONTRAST:  158mL ISOVUE-300 IOPAMIDOL (ISOVUE-300) INJECTION  61% COMPARISON:  12/20/2013 FINDINGS: Lower chest:  No contributory findings. Hepatobiliary: No focal liver abnormality.No evidence of biliary obstruction or stone. Pancreas: Unremarkable. Spleen: Unremarkable. Adrenals/Urinary Tract: Negative adrenals. No hydronephrosis or stone. Unremarkable bladder. Stomach/Bowel:  No obstruction. No appendicitis. Vascular/Lymphatic: No acute vascular abnormality. Early atheromatous calcification of the aorta. No mass or adenopathy. Reproductive:No pathologic findings. Other: No ascites or pneumoperitoneum. Musculoskeletal: Negative. IMPRESSION: No acute finding or explanation for symptoms. Electronically Signed   By: Monte Fantasia M.D.   On: 08/04/2018 06:17    ED COURSE and MDM  Nursing notes and initial vitals signs, including pulse oximetry, reviewed.  Vitals:   08/04/18 0504 08/04/18 0508  BP: (!) 130/97   Pulse: 94   Resp: 20   Temp: 98.2 F (36.8 C)   TempSrc: Oral   SpO2: 99%   Weight:  95.3 kg  Height:  6\' 3"  (1.905 m)   6:45 AM Abdomen soft, nontender.  Patient advised of reassuring CT findings.  He was advised of CBC findings.  He has no other symptoms to suggest infectious mononucleosis.  He was advised to return if symptoms are worsening or changing.  PROCEDURES    ED DIAGNOSES     ICD-10-CM   1. Left lower quadrant abdominal pain R10.32   2. Nausea and vomiting in adult R11.2        Shanon Rosser, MD 08/04/18 0768    Shanon Rosser, MD 08/04/18 [phone]

## 2018-08-05 ENCOUNTER — Telehealth: Payer: Self-pay | Admitting: *Deleted

## 2018-08-05 ENCOUNTER — Ambulatory Visit: Payer: BLUE CROSS/BLUE SHIELD

## 2018-08-05 ENCOUNTER — Ambulatory Visit (INDEPENDENT_AMBULATORY_CARE_PROVIDER_SITE_OTHER): Payer: BLUE CROSS/BLUE SHIELD

## 2018-08-05 ENCOUNTER — Ambulatory Visit (INDEPENDENT_AMBULATORY_CARE_PROVIDER_SITE_OTHER): Payer: BLUE CROSS/BLUE SHIELD | Admitting: Podiatry

## 2018-08-05 ENCOUNTER — Encounter: Payer: Self-pay | Admitting: Podiatry

## 2018-08-05 VITALS — BP 122/77 | HR 84 | Resp 16

## 2018-08-05 DIAGNOSIS — M205X2 Other deformities of toe(s) (acquired), left foot: Secondary | ICD-10-CM | POA: Diagnosis not present

## 2018-08-05 DIAGNOSIS — M2042 Other hammer toe(s) (acquired), left foot: Secondary | ICD-10-CM

## 2018-08-05 DIAGNOSIS — M779 Enthesopathy, unspecified: Principal | ICD-10-CM

## 2018-08-05 DIAGNOSIS — Q828 Other specified congenital malformations of skin: Secondary | ICD-10-CM | POA: Diagnosis not present

## 2018-08-05 DIAGNOSIS — M79676 Pain in unspecified toe(s): Secondary | ICD-10-CM

## 2018-08-05 DIAGNOSIS — L603 Nail dystrophy: Secondary | ICD-10-CM

## 2018-08-05 DIAGNOSIS — B351 Tinea unguium: Secondary | ICD-10-CM | POA: Diagnosis not present

## 2018-08-05 DIAGNOSIS — M2041 Other hammer toe(s) (acquired), right foot: Secondary | ICD-10-CM

## 2018-08-05 DIAGNOSIS — Z79899 Other long term (current) drug therapy: Secondary | ICD-10-CM

## 2018-08-05 DIAGNOSIS — M778 Other enthesopathies, not elsewhere classified: Secondary | ICD-10-CM

## 2018-08-05 MED ORDER — TERBINAFINE HCL 250 MG PO TABS: 250.0000 mg | ORAL_TABLET | Freq: Every day | ORAL | 0 refills | Status: DC

## 2018-08-05 NOTE — Telephone Encounter (Signed)
I called the patient and informed him that Dr. Milinda Pointer does not have anything available on August 20, 2018 for surgery.  I informed him that Dr. Milinda Pointer can do it on August 27, 2018.  He said that date was fine.

## 2018-08-05 NOTE — Patient Instructions (Signed)
Pre-Operative Instructions  Congratulations, you have decided to take an important step towards improving your quality of life.  You can be assured that the doctors and staff at [facility] will be with you every step of the way.  Here are some important things you should know:  1. Plan to be at the surgery center/hospital at least 1 (one) hour prior to your scheduled time, unless otherwise directed by the surgical center/hospital staff.  You must have a responsible adult accompany you, remain during the surgery and drive you home.  Make sure you have directions to the surgical center/hospital to ensure you arrive on time. 2. If you are having surgery at [facility] or [facility] hospitals, you will need a copy of your medical history and physical form from your family physician within one month prior to the date of surgery. We will give you a form for your primary physician to complete.  3. We make every effort to accommodate the date you request for surgery.  However, there are times where surgery dates or times have to be moved.  We will contact you as soon as possible if a change in schedule is required.   4. No aspirin/ibuprofen for one week before surgery.  If you are on aspirin, any non-steroidal anti-inflammatory medications (Mobic, Aleve, Ibuprofen) should not be taken seven (7) days prior to your surgery.  You make take Tylenol for pain prior to surgery.  5. Medications - If you are taking daily heart and blood pressure medications, seizure, reflux, allergy, asthma, anxiety, pain or diabetes medications, make sure you notify the surgery center/hospital before the day of surgery so they can tell you which medications you should take or avoid the day of surgery. 6. No food or drink after midnight the night before surgery unless directed otherwise by surgical center/hospital staff. 7. No alcoholic beverages 24-hours prior to surgery.  No smoking 24-hours prior or 24-hours after  surgery. 8. Wear loose pants or shorts. They should be loose enough to fit over bandages, boots, and casts. 9. Don't wear slip-on shoes. Sneakers are preferred. 10. Bring your boot with you to the surgery center/hospital.  Also bring crutches or a walker if your physician has prescribed it for you.  If you do not have this equipment, it will be provided for you after surgery. 11. If you have not been contacted by the surgery center/hospital by the day before your surgery, call to confirm the date and time of your surgery. 12. Leave-time from work may vary depending on the type of surgery you have.  Appropriate arrangements should be made prior to surgery with your employer. 13. Prescriptions will be provided immediately following surgery by your doctor.  Fill these as soon as possible after surgery and take the medication as directed. Pain medications will not be refilled on weekends and must be approved by the doctor. 14. Remove nail polish on the operative foot and avoid getting pedicures prior to surgery. 15. Wash the night before surgery.  The night before surgery wash the foot and leg well with water and the antibacterial soap provided. Be sure to pay special attention to beneath the toenails and in between the toes.  Wash for at least three (3) minutes. Rinse thoroughly with water and dry well with a towel.  Perform this wash unless told not to do so by your physician.  Enclosed: 1 Ice pack (please put in freezer the night before surgery)   1 Hibiclens skin cleaner     Pre-op instructions  If you have any questions regarding the instructions, please do not hesitate to call our office.  [facility]: [address], [place], [place] [address] -- [phone]  [facility]: [address] [place], [place] [address] -- [phone]  [place]: [address]  [place], [place] [address] -- [phone]  [place]: [address], [address], [place], [place] [address] -- [phone]  Website:  [email or web address] 

## 2018-08-05 NOTE — Progress Notes (Signed)
He presents today for follow-up of pain to the forefoot particularly beneath the hallux interphalangeal joint bilaterally.  He is also complaining of painfully thick toenails bilaterally.  He is also stating that his hammertoes are starting to develop and crossover.  Objective: Vital signs are stable he is alert oriented x3 pulses are palpable.  Neurologic sensorium is intact DP reflexes are intact muscle strength is normal symmetrical bilateral.  He has flexible hammertoe deformities 2 through 5 bilaterally.  Hallux rigidus first metatarsophalangeal joint bilaterally left greater than right.  He also has reactive hyper keratomas plantar aspect of the forefoot particularly the hallux interphalangeal joint bilaterally and the fifth metatarsal head bilaterally.  Cutaneous evaluation of straight supple well-hydrated cutis other than reactive hyperkeratosis his toenails are thick yellow dystrophic onychomycotic and previous pathology report does demonstrate onychomycosis of the saprophytic type.  Assessment: Pain in limb secondary to hallux limitus bilateral with hammertoe deformities bilaterally.  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  Plan: Discussed etiology pathology and surgical therapies.  Debrided toenails 1 through 5 bilaterally.  Greeted all reactive hyperkeratotic tissue.  We went over a consent form today line by line number by number giving him ample time to ask questions he saw fit regarding a Keller arthroplasty with a single silicone implant and flexor tenotomy's to toes #2 #3 #4 of the left foot.  I answered all of the questions regarding these procedures to the best of my ability in layman's terms she understood was amenable to it signed all 3 pages of the consent form.  We also provided him with both oral and written home-going instructions for the morning of surgery the surgery center and anesthesia.  He will follow-up with [place] in the near future for  surgical intervention.

## 2018-08-10 ENCOUNTER — Encounter: Payer: Self-pay | Admitting: Nurse Practitioner

## 2018-08-10 ENCOUNTER — Ambulatory Visit (INDEPENDENT_AMBULATORY_CARE_PROVIDER_SITE_OTHER): Payer: BLUE CROSS/BLUE SHIELD

## 2018-08-10 ENCOUNTER — Ambulatory Visit (INDEPENDENT_AMBULATORY_CARE_PROVIDER_SITE_OTHER): Payer: BLUE CROSS/BLUE SHIELD | Admitting: Nurse Practitioner

## 2018-08-10 VITALS — BP 120/74 | HR 94 | Temp 98.9°F | Ht 75.0 in | Wt 218.0 lb

## 2018-08-10 DIAGNOSIS — Z720 Tobacco use: Secondary | ICD-10-CM | POA: Diagnosis not present

## 2018-08-10 DIAGNOSIS — R0789 Other chest pain: Secondary | ICD-10-CM

## 2018-08-10 DIAGNOSIS — J209 Acute bronchitis, unspecified: Secondary | ICD-10-CM

## 2018-08-10 DIAGNOSIS — L989 Disorder of the skin and subcutaneous tissue, unspecified: Secondary | ICD-10-CM | POA: Insufficient documentation

## 2018-08-10 DIAGNOSIS — J0140 Acute pansinusitis, unspecified: Secondary | ICD-10-CM | POA: Diagnosis not present

## 2018-08-10 DIAGNOSIS — J9801 Acute bronchospasm: Secondary | ICD-10-CM

## 2018-08-10 DIAGNOSIS — L852 Keratosis punctata (palmaris et plantaris): Secondary | ICD-10-CM | POA: Insufficient documentation

## 2018-08-10 DIAGNOSIS — R05 Cough: Secondary | ICD-10-CM | POA: Diagnosis not present

## 2018-08-10 MED ORDER — AZITHROMYCIN 250 MG PO TABS: 250.0000 mg | ORAL_TABLET | Freq: Every day | ORAL | 0 refills | Status: DC

## 2018-08-10 MED ORDER — BENZONATATE 100 MG PO CAPS: 100.0000 mg | ORAL_CAPSULE | Freq: Three times a day (TID) | ORAL | 0 refills | Status: DC | PRN

## 2018-08-10 MED ORDER — CETIRIZINE HCL 10 MG PO TABS: 10.0000 mg | ORAL_TABLET | Freq: Every day | ORAL | Status: DC

## 2018-08-10 MED ORDER — GUAIFENESIN ER 600 MG PO TB12: 600.0000 mg | ORAL_TABLET | Freq: Two times a day (BID) | ORAL | 0 refills | Status: DC | PRN

## 2018-08-10 MED ORDER — FLUTICASONE PROPIONATE 50 MCG/ACT NA SUSP: 2.0000 | Freq: Every day | NASAL | 0 refills | Status: DC

## 2018-08-10 NOTE — Progress Notes (Signed)
Subjective:  Patient ID: Ronald Jackson, male    DOB: 13-Jun-1981  Age: 38 y.o. MRN: [id number]  CC: Establish Care (est care/pt is c/o of LUQ pain when coughing,going on for 2 wks- along with congestion,sneezing,cold-tried ibuprofen/ pt also c/o of skin problem--irritate-going on for years--req referral to dermatology. )  Rash  This is a chronic problem. The current episode started more than 1 year ago. The problem is unchanged. The affected locations include the left lower leg, left foot, right foot and right lower leg. The rash is characterized by dryness, itchiness and peeling. He was exposed to nothing. Associated symptoms include coughing, rhinorrhea and a sore throat. Pertinent negatives include no fatigue, fever, joint pain, nail changes or shortness of breath. Past treatments include anti-itch cream and topical steroids. The treatment provided no relief.  Cough  This is a new problem. The current episode started 1 to 4 weeks ago. The problem has been gradually worsening. The problem occurs constantly. The cough is non-productive. Associated symptoms include chest pain, chills, headaches, nasal congestion, postnasal drip, a rash, rhinorrhea and a sore throat. Pertinent negatives include no fever, shortness of breath, sweats, weight loss or wheezing. The symptoms are aggravated by cold air, fumes, pollens and lying down. Risk factors for lung disease include smoking/tobacco exposure. He has tried OTC cough suppressant for the symptoms. The treatment provided no relief.  Coughing and sneezing, onset 2weeks ago, associated with left side ABD pain. Worse at work due to irritants in air. Tobacco use: 1/2ppd, since age 44, menthol cigarettes Marijuana use 2x/month  Rash on legs, onset 51yrs ago, seen by dermatologist years ago, steriod cream prescribed with no improvement.  Reviewed past Medical, Social and Family history today.  Outpatient Medications Prior to Visit  Medication Sig Dispense  Refill  . terbinafine (LAMISIL) 250 MG tablet Take 1 tablet (250 mg total) by mouth daily. 30 tablet 0   No facility-administered medications prior to visit.     ROS See HPI  Objective:  BP 120/74   Pulse 94   Temp 98.9 F (37.2 C) (Oral)   Ht 6\' 3"  (1.905 m)   Wt 218 lb (98.9 kg)   SpO2 97%   BMI 27.25 kg/m   BP Readings from Last 3 Encounters:  08/10/18 120/74  08/05/18 122/77  08/04/18 116/85    Wt Readings from Last 3 Encounters:  08/10/18 218 lb (98.9 kg)  08/04/18 210 lb (95.3 kg)  07/05/17 210 lb (95.3 kg)    Physical Exam HENT:     Right Ear: Tympanic membrane, ear canal and external ear normal.     Left Ear: Tympanic membrane, ear canal and external ear normal.     Nose: Mucosal edema and rhinorrhea present.     Right Turbinates: Swollen.     Left Turbinates: Swollen.     Right Sinus: Maxillary sinus tenderness and frontal sinus tenderness present.     Left Sinus: Maxillary sinus tenderness and frontal sinus tenderness present.     Mouth/Throat:     Pharynx: Posterior oropharyngeal erythema present. No oropharyngeal exudate.     Tonsils: Swelling: 1+ on the right. 1+ on the left.  Neck:     Musculoskeletal: Normal range of motion and neck supple.  Cardiovascular:     Rate and Rhythm: Normal rate and regular rhythm.     Pulses: Normal pulses.     Heart sounds: Normal heart sounds.  Pulmonary:     Effort: Pulmonary effort is normal.  Breath sounds: Normal breath sounds.  Lymphadenopathy:     Cervical: No cervical adenopathy.  Skin:    Findings: Lesion and rash present. Rash is nodular and scaling.       Psychiatric:        Mood and Affect: Mood normal.        Behavior: Behavior normal.     Lab Results  Component Value Date   WBC 5.3 08/04/2018   HGB 13.8 08/04/2018   HCT 41.3 08/04/2018   PLT 179 08/04/2018   GLUCOSE 97 08/04/2018   ALT 16 (L) 07/05/2017   AST 20 07/05/2017   NA 136 08/04/2018   K 3.3 (L) 08/04/2018   CL 103  08/04/2018   CREATININE 1.15 08/04/2018   BUN 11 08/04/2018   CO2 27 08/04/2018    Ct Abdomen Pelvis W Contrast  Result Date: 08/04/2018 CLINICAL DATA:  Left lower quadrant pain for 2 weeks EXAM: CT ABDOMEN AND PELVIS WITH CONTRAST TECHNIQUE: Multidetector CT imaging of the abdomen and pelvis was performed using the standard protocol following bolus administration of intravenous contrast. CONTRAST:  128mL ISOVUE-300 IOPAMIDOL (ISOVUE-300) INJECTION 61% COMPARISON:  12/20/2013 FINDINGS: Lower chest:  No contributory findings. Hepatobiliary: No focal liver abnormality.No evidence of biliary obstruction or stone. Pancreas: Unremarkable. Spleen: Unremarkable. Adrenals/Urinary Tract: Negative adrenals. No hydronephrosis or stone. Unremarkable bladder. Stomach/Bowel:  No obstruction. No appendicitis. Vascular/Lymphatic: No acute vascular abnormality. Early atheromatous calcification of the aorta. No mass or adenopathy. Reproductive:No pathologic findings. Other: No ascites or pneumoperitoneum. Musculoskeletal: Negative. IMPRESSION: No acute finding or explanation for symptoms. Electronically Signed   By: Monte Fantasia M.D.   On: 08/04/2018 06:17   Dg Foot Complete Left  Result Date: 08/05/2018 Please see detailed radiograph report in office note.  Dg Foot Complete Right  Result Date: 08/05/2018 Please see detailed radiograph report in office note.   Assessment & Plan:   [name] was seen today for establish care.  Diagnoses and all orders for this visit:  Acute non-recurrent pansinusitis -     cetirizine (ZYRTEC) 10 MG tablet; Take 1 tablet (10 mg total) by mouth at bedtime. x14days -     fluticasone (FLONASE) 50 MCG/ACT nasal spray; Place 2 sprays into both nostrils daily. -     azithromycin (ZITHROMAX Z-PAK) 250 MG tablet; Take 1 tablet (250 mg total) by mouth daily. Take 2tabs on first day, then 1tab once a day till complete  Tobacco use -     DG Chest 2 View  Acute bronchitis,  unspecified organism -     DG Chest 2 View -     guaiFENesin (MUCINEX) 600 MG 12 hr tablet; Take 1 tablet (600 mg total) by mouth 2 (two) times daily as needed for cough or to loosen phlegm. -     benzonatate (TESSALON) 100 MG capsule; Take 1 capsule (100 mg total) by mouth 3 (three) times daily as needed for cough.  Anterior chest wall pain -     DG Chest 2 View  Skin lesion of lower extremity -     Ambulatory referral to Dermatology   I am having Marliss Czar start on guaiFENesin, cetirizine, fluticasone, azithromycin, and benzonatate. I am also having him maintain his terbinafine.  Meds ordered this encounter  Medications  . guaiFENesin (MUCINEX) 600 MG 12 hr tablet    Sig: Take 1 tablet (600 mg total) by mouth 2 (two) times daily as needed for cough or to loosen phlegm.    Dispense:  14 tablet  Refill:  0    Order Specific Question:   Supervising Provider    Answer:   MATTHEWS, CODY [[id number]]  . cetirizine (ZYRTEC) 10 MG tablet    Sig: Take 1 tablet (10 mg total) by mouth at bedtime. x14days    Order Specific Question:   Supervising Provider    Answer:   MATTHEWS, CODY [[id number]]  . fluticasone (FLONASE) 50 MCG/ACT nasal spray    Sig: Place 2 sprays into both nostrils daily.    Dispense:  16 g    Refill:  0    Order Specific Question:   Supervising Provider    Answer:   MATTHEWS, CODY [[id number]]  . azithromycin (ZITHROMAX Z-PAK) 250 MG tablet    Sig: Take 1 tablet (250 mg total) by mouth daily. Take 2tabs on first day, then 1tab once a day till complete    Dispense:  6 tablet    Refill:  0    Order Specific Question:   Supervising Provider    Answer:   MATTHEWS, CODY [[id number]]  . benzonatate (TESSALON) 100 MG capsule    Sig: Take 1 capsule (100 mg total) by mouth 3 (three) times daily as needed for cough.    Dispense:  20 capsule    Refill:  0    Order Specific Question:   Supervising Provider    Answer:   MATTHEWS, CODY [[id number]]    Problem List Items Addressed This Visit        Musculoskeletal and Integument   Skin lesion of lower extremity   Relevant Orders   Ambulatory referral to Dermatology     Other   Tobacco use   Relevant Orders   DG Chest 2 View (Completed)    Other Visit Diagnoses    Acute non-recurrent pansinusitis    -  Primary   Relevant Medications   guaiFENesin (MUCINEX) 600 MG 12 hr tablet   cetirizine (ZYRTEC) 10 MG tablet   fluticasone (FLONASE) 50 MCG/ACT nasal spray   azithromycin (ZITHROMAX Z-PAK) 250 MG tablet   benzonatate (TESSALON) 100 MG capsule   Acute bronchitis, unspecified organism       Relevant Medications   guaiFENesin (MUCINEX) 600 MG 12 hr tablet   benzonatate (TESSALON) 100 MG capsule   Other Relevant Orders   DG Chest 2 View (Completed)   Anterior chest wall pain       Relevant Orders   DG Chest 2 View (Completed)       Follow-up: Return in about 2 weeks (around 08/24/2018) for CPE (fasting).  [name], NP

## 2018-08-10 NOTE — Patient Instructions (Addendum)
Normal CXR. Oral abx prescribed to treat bacterial sinusitis  Use tylenol or ibuprofen OTC as needed for pain

## 2018-08-25 ENCOUNTER — Other Ambulatory Visit: Payer: Self-pay | Admitting: Podiatry

## 2018-08-25 MED ORDER — OXYCODONE-ACETAMINOPHEN 10-325 MG PO TABS: 1.0000 | ORAL_TABLET | Freq: Four times a day (QID) | ORAL | 0 refills | Status: DC | PRN

## 2018-08-25 MED ORDER — CEPHALEXIN 500 MG PO CAPS: 500.0000 mg | ORAL_CAPSULE | Freq: Three times a day (TID) | ORAL | 0 refills | Status: DC

## 2018-08-25 MED ORDER — ONDANSETRON HCL 4 MG PO TABS: 4.0000 mg | ORAL_TABLET | Freq: Three times a day (TID) | ORAL | 0 refills | Status: DC | PRN

## 2018-08-27 ENCOUNTER — Encounter: Payer: Self-pay | Admitting: Podiatry

## 2018-08-27 ENCOUNTER — Other Ambulatory Visit: Payer: Self-pay | Admitting: Podiatry

## 2018-08-27 DIAGNOSIS — M24575 Contracture, left foot: Secondary | ICD-10-CM | POA: Diagnosis not present

## 2018-08-27 DIAGNOSIS — M205X2 Other deformities of toe(s) (acquired), left foot: Secondary | ICD-10-CM | POA: Diagnosis not present

## 2018-08-27 DIAGNOSIS — M2042 Other hammer toe(s) (acquired), left foot: Secondary | ICD-10-CM | POA: Diagnosis not present

## 2018-08-27 DIAGNOSIS — M2022 Hallux rigidus, left foot: Secondary | ICD-10-CM | POA: Diagnosis not present

## 2018-08-27 DIAGNOSIS — M25572 Pain in left ankle and joints of left foot: Secondary | ICD-10-CM | POA: Diagnosis not present

## 2018-08-27 DIAGNOSIS — Z01818 Encounter for other preprocedural examination: Secondary | ICD-10-CM | POA: Diagnosis not present

## 2018-08-27 DIAGNOSIS — M21612 Bunion of left foot: Secondary | ICD-10-CM | POA: Diagnosis not present

## 2018-08-27 MED ORDER — OXYCODONE-ACETAMINOPHEN 10-325 MG PO TABS: 1.0000 | ORAL_TABLET | Freq: Four times a day (QID) | ORAL | 0 refills | Status: AC | PRN

## 2018-08-27 NOTE — Progress Notes (Signed)
Sent pain medication as the pharmacy wound not accept written rx.

## 2018-08-31 ENCOUNTER — Telehealth: Payer: Self-pay | Admitting: *Deleted

## 2018-08-31 NOTE — Telephone Encounter (Signed)
I am calling on the behalf of [employer] and the patient to see when he is scheduled for surgery."  He was supposed to have had surgery on March 13.  "Did he have it?"  As far as I know he did.  Does he not know when he had the surgery?  "Sometime they get a little confused.  What's the fax number there and the address?"  The fax number is [phone] and the address is [address], [place], [place] [address].

## 2018-09-01 ENCOUNTER — Encounter: Payer: BLUE CROSS/BLUE SHIELD | Admitting: Nurse Practitioner

## 2018-09-02 ENCOUNTER — Ambulatory Visit (INDEPENDENT_AMBULATORY_CARE_PROVIDER_SITE_OTHER): Payer: Self-pay | Admitting: Podiatry

## 2018-09-02 ENCOUNTER — Ambulatory Visit: Payer: BLUE CROSS/BLUE SHIELD | Admitting: Podiatry

## 2018-09-02 ENCOUNTER — Ambulatory Visit (INDEPENDENT_AMBULATORY_CARE_PROVIDER_SITE_OTHER): Payer: BLUE CROSS/BLUE SHIELD

## 2018-09-02 ENCOUNTER — Other Ambulatory Visit: Payer: Self-pay

## 2018-09-02 VITALS — Temp 98.4°F

## 2018-09-02 DIAGNOSIS — M2042 Other hammer toe(s) (acquired), left foot: Secondary | ICD-10-CM

## 2018-09-02 DIAGNOSIS — Z09 Encounter for follow-up examination after completed treatment for conditions other than malignant neoplasm: Secondary | ICD-10-CM

## 2018-09-02 DIAGNOSIS — M2041 Other hammer toe(s) (acquired), right foot: Secondary | ICD-10-CM

## 2018-09-02 DIAGNOSIS — M205X2 Other deformities of toe(s) (acquired), left foot: Secondary | ICD-10-CM

## 2018-09-02 MED ORDER — OXYCODONE-ACETAMINOPHEN 10-325 MG PO TABS: 1.0000 | ORAL_TABLET | Freq: Four times a day (QID) | ORAL | 0 refills | Status: AC | PRN

## 2018-09-02 NOTE — Progress Notes (Signed)
He presents today date of surgery 08/27/2018 status post Ronald Jackson bunionectomy implant with tenotomies of the toes.  He states that my foot throbs at night but is okay during the day.  Objective: Vital signs are stable alert and oriented x3.  Pulses are palpable.  There is no erythema edema cellulitis drainage odor he does have what appears to be a first intermetatarsal space hematoma present but he has good range of motion of the toe passive range of motion though is somewhat tender.  Otherwise the other toes are sitting rectus.  Radiographs today demonstrate a well-healing surgical toe.  Plan: Redressed today dressed a compressive dressing encourage range of motion exercises follow-up with him in 1 week at which time we hope to be able to remove the sutures if not we could at least remove the ones from the toes but leave the one intact to the big toe and first metatarsal phalangeal joint.

## 2018-09-14 ENCOUNTER — Encounter: Payer: Self-pay | Admitting: Podiatry

## 2018-09-14 ENCOUNTER — Other Ambulatory Visit: Payer: Self-pay

## 2018-09-14 ENCOUNTER — Ambulatory Visit (INDEPENDENT_AMBULATORY_CARE_PROVIDER_SITE_OTHER): Payer: BLUE CROSS/BLUE SHIELD | Admitting: Podiatry

## 2018-09-14 VITALS — Temp 97.8°F

## 2018-09-14 DIAGNOSIS — Z09 Encounter for follow-up examination after completed treatment for conditions other than malignant neoplasm: Secondary | ICD-10-CM

## 2018-09-14 DIAGNOSIS — M205X2 Other deformities of toe(s) (acquired), left foot: Secondary | ICD-10-CM

## 2018-09-14 NOTE — Progress Notes (Signed)
Subjective: Ronald Jackson is a 38 y.o. is seen today in office s/p right foot Keller bunionectomy with implant tenotomies of the digits preformed on 08/27/2018.  Overall he states that his pain is improving but he still gets some discomfort on surgical site.  Doing in their cam boot.  He presents here for suture removal.  Denies any systemic complaints such as fevers, chills, nausea, vomiting. No calf pain, chest pain, shortness of breath.   Objective: General: No acute distress, AAOx3  DP/PT pulses palpable 2/4, CRT < 3 sec to all digits.  Protective sensation intact. Motor function intact.  RIGHT foot: Incision is well coapted without any evidence of dehiscence with sutures intact. There is no surrounding erythema, ascending cellulitis, fluctuance, crepitus, malodor, drainage/purulence. There is mild edema around the surgical site. There is mild pain along the surgical site.  Mild discomfort with MPJ range of motion. No other areas of tenderness to bilateral lower extremities.  No other open lesions or pre-ulcerative lesions.  No pain with calf compression, swelling, warmth, erythema.   Assessment and Plan:  Status post right foot surgery, doing well with no complications   -Treatment options discussed including all alternatives, risks, and complications -Sutures removed today after Incision very well coapted.  Antibiotic ointment and a bandage was applied.  He can start to shower in 2 days as long as the incision is doing well.  In the meantime on him to continue the cam boot and discussed starting gentle range of motion exercises with the first MPJ. -Ice/elevation -Pain medication as needed. -Monitor for any clinical signs or symptoms of infection and DVT/PE and directed to call the office immediately should any occur or go to the ER. -Follow-up in 2 weeks with Dr. Milinda Pointer or sooner if any problems arise. In the meantime, encouraged to call the office with any questions, concerns, change in  symptoms.   Celesta Gentile, DPM

## 2018-09-17 ENCOUNTER — Encounter: Payer: Self-pay | Admitting: Sports Medicine

## 2018-09-17 ENCOUNTER — Other Ambulatory Visit: Payer: Self-pay | Admitting: Sports Medicine

## 2018-09-17 MED ORDER — HYDROCODONE-ACETAMINOPHEN 10-325 MG PO TABS: 1.0000 | ORAL_TABLET | Freq: Four times a day (QID) | ORAL | 0 refills | Status: AC | PRN

## 2018-09-17 NOTE — Progress Notes (Signed)
For post op pain. Patient called answering service reporting increased pain after his dressing came off. Re-wrapped foot the best he could but is out of pain medication and is wanting a refill since did not get it earlier this week. I sent a few tablets to get him through the weekend until he can follow up in office to have his pain re-assesed -Dr Chauncey Cruel

## 2018-09-26 ENCOUNTER — Telehealth: Payer: Self-pay | Admitting: Podiatry

## 2018-09-26 NOTE — Telephone Encounter (Signed)
This patient called on 4/12 at 8:02 am.  He is scared since his 2,3 toes are numb and not moving.  He has surgery by Dr.  Milinda Pointer 4 weeks ago for hallux limitus and hammer toes.  He had tenotomies performed on 2,3.  He is having no redness or pain.  He says he is scared about his numbness and inability to move toes.  Told this patient he may be guarding his surgical site which has led to numbness and loss of motion.  Reassured this patient and told him to call the office tomorrow if condition worsens.  Gardiner Barefoot DPM

## 2018-09-27 ENCOUNTER — Telehealth: Payer: Self-pay | Admitting: Podiatry

## 2018-09-27 ENCOUNTER — Other Ambulatory Visit: Payer: Self-pay | Admitting: Podiatry

## 2018-09-27 MED ORDER — HYDROCODONE-ACETAMINOPHEN 5-325 MG PO TABS: 1.0000 | ORAL_TABLET | Freq: Four times a day (QID) | ORAL | 0 refills | Status: DC | PRN

## 2018-09-27 NOTE — Telephone Encounter (Signed)
Pt is requesting pain meds/ has an appointment tomorrow at 10:30 with Dr. Milinda Pointer

## 2018-09-27 NOTE — Telephone Encounter (Signed)
I informed pt of Dr. Leigh Aurora orders.

## 2018-09-27 NOTE — Telephone Encounter (Signed)
Pt states the 2, 3, 4th toes are getting sharp pain and when flex the toes, and heel toe exercise he has sharp pain shooting pain, and it is making him anxious like something is wrong. The pain medication last prescribed has helped with the ibuprofen. I told pt to remain in the boot, skip the PT until seen by Dr. Milinda Pointer tomorrow.

## 2018-09-27 NOTE — Telephone Encounter (Signed)
Sent vicodin to the pharmacy.

## 2018-09-28 ENCOUNTER — Encounter: Payer: Self-pay | Admitting: Podiatry

## 2018-09-28 ENCOUNTER — Telehealth: Payer: Self-pay | Admitting: *Deleted

## 2018-09-28 ENCOUNTER — Other Ambulatory Visit: Payer: Self-pay

## 2018-09-28 ENCOUNTER — Ambulatory Visit (INDEPENDENT_AMBULATORY_CARE_PROVIDER_SITE_OTHER): Payer: BLUE CROSS/BLUE SHIELD | Admitting: Podiatry

## 2018-09-28 ENCOUNTER — Other Ambulatory Visit: Payer: BLUE CROSS/BLUE SHIELD

## 2018-09-28 ENCOUNTER — Ambulatory Visit (INDEPENDENT_AMBULATORY_CARE_PROVIDER_SITE_OTHER): Payer: BLUE CROSS/BLUE SHIELD

## 2018-09-28 VITALS — Temp 96.8°F

## 2018-09-28 DIAGNOSIS — M21619 Bunion of unspecified foot: Secondary | ICD-10-CM

## 2018-09-28 DIAGNOSIS — M205X2 Other deformities of toe(s) (acquired), left foot: Secondary | ICD-10-CM

## 2018-09-28 DIAGNOSIS — M2042 Other hammer toe(s) (acquired), left foot: Secondary | ICD-10-CM

## 2018-09-28 DIAGNOSIS — Z09 Encounter for follow-up examination after completed treatment for conditions other than malignant neoplasm: Secondary | ICD-10-CM

## 2018-09-28 MED ORDER — METHYLPREDNISOLONE 4 MG PO TBPK: ORAL_TABLET | ORAL | 0 refills | Status: DC

## 2018-09-28 NOTE — Telephone Encounter (Signed)
-----   Message from Rip Har[place], [place] sent at 09/28/2018 11:00 AM EDT ----- Regarding: PT [facility] In house -    S/p keller bunionectomy right DOS 08/27/18  Duration: 3 x week x 4 weeks

## 2018-09-28 NOTE — Telephone Encounter (Signed)
I hand delivered [facility] - In-office.

## 2018-09-28 NOTE — Progress Notes (Signed)
He presents today date of surgery 08/27/2018 status post Ronald Jackson bunionectomy left foot with tenotomies to toes #2 #3 #4 of the left foot.  States that he is doing okay but has had a lot of pain recently.  Objective: Vital signs are stable he is alert and oriented x3 radiographs demonstrate Ronald Jackson is intact with implant in good position and grommets intact.  Toes are lying rectus and are nontender.  He has no tenderness on range of motion of the first metatarsal minimally but as it increases is becoming more tender.  Assessment: Well-healing surgical foot 1 month status post Keller arthroplasty with implant left and tenotomies toes 234 left.  Plan: Start him on a Medrol Dosepak send him to physical therapy placed him a compression anklet and a Darco shoe.  I also remove the remainder of the sutures to the plantar aspect of the toes 2 3 and 4 left.

## 2018-10-01 DIAGNOSIS — M25675 Stiffness of left foot, not elsewhere classified: Secondary | ICD-10-CM | POA: Diagnosis not present

## 2018-10-01 DIAGNOSIS — M25672 Stiffness of left ankle, not elsewhere classified: Secondary | ICD-10-CM | POA: Diagnosis not present

## 2018-10-01 DIAGNOSIS — M25572 Pain in left ankle and joints of left foot: Secondary | ICD-10-CM | POA: Diagnosis not present

## 2018-10-04 DIAGNOSIS — M25675 Stiffness of left foot, not elsewhere classified: Secondary | ICD-10-CM | POA: Diagnosis not present

## 2018-10-04 DIAGNOSIS — R269 Unspecified abnormalities of gait and mobility: Secondary | ICD-10-CM | POA: Diagnosis not present

## 2018-10-04 DIAGNOSIS — M25572 Pain in left ankle and joints of left foot: Secondary | ICD-10-CM | POA: Diagnosis not present

## 2018-10-04 DIAGNOSIS — M25672 Stiffness of left ankle, not elsewhere classified: Secondary | ICD-10-CM | POA: Diagnosis not present

## 2018-10-06 DIAGNOSIS — M25572 Pain in left ankle and joints of left foot: Secondary | ICD-10-CM | POA: Diagnosis not present

## 2018-10-06 DIAGNOSIS — M25675 Stiffness of left foot, not elsewhere classified: Secondary | ICD-10-CM | POA: Diagnosis not present

## 2018-10-06 DIAGNOSIS — R269 Unspecified abnormalities of gait and mobility: Secondary | ICD-10-CM | POA: Diagnosis not present

## 2018-10-06 DIAGNOSIS — M25672 Stiffness of left ankle, not elsewhere classified: Secondary | ICD-10-CM | POA: Diagnosis not present

## 2018-10-08 DIAGNOSIS — R269 Unspecified abnormalities of gait and mobility: Secondary | ICD-10-CM | POA: Diagnosis not present

## 2018-10-08 DIAGNOSIS — M25572 Pain in left ankle and joints of left foot: Secondary | ICD-10-CM | POA: Diagnosis not present

## 2018-10-08 DIAGNOSIS — M25672 Stiffness of left ankle, not elsewhere classified: Secondary | ICD-10-CM | POA: Diagnosis not present

## 2018-10-08 DIAGNOSIS — M25675 Stiffness of left foot, not elsewhere classified: Secondary | ICD-10-CM | POA: Diagnosis not present

## 2018-10-11 ENCOUNTER — Telehealth: Payer: Self-pay | Admitting: Podiatry

## 2018-10-11 DIAGNOSIS — R269 Unspecified abnormalities of gait and mobility: Secondary | ICD-10-CM | POA: Diagnosis not present

## 2018-10-11 DIAGNOSIS — M25572 Pain in left ankle and joints of left foot: Secondary | ICD-10-CM | POA: Diagnosis not present

## 2018-10-11 DIAGNOSIS — M25675 Stiffness of left foot, not elsewhere classified: Secondary | ICD-10-CM | POA: Diagnosis not present

## 2018-10-11 DIAGNOSIS — M25672 Stiffness of left ankle, not elsewhere classified: Secondary | ICD-10-CM | POA: Diagnosis not present

## 2018-10-11 MED ORDER — HYDROCODONE-ACETAMINOPHEN 5-325 MG PO TABS: 1.0000 | ORAL_TABLET | Freq: Four times a day (QID) | ORAL | 0 refills | Status: DC | PRN

## 2018-10-11 NOTE — Telephone Encounter (Signed)
Patient would like a refill on pain medication also has medication questions.

## 2018-10-11 NOTE — Addendum Note (Signed)
Addended by: Hardie Pulley on: 10/11/2018 06:43 PM   Modules accepted: Orders

## 2018-10-12 ENCOUNTER — Telehealth: Payer: Self-pay | Admitting: Nurse Practitioner

## 2018-10-12 ENCOUNTER — Other Ambulatory Visit: Payer: BLUE CROSS/BLUE SHIELD

## 2018-10-12 NOTE — Telephone Encounter (Signed)
Pt states the oxycodone made him feel beside himself in his thinking and the hydrocodone does not cover the pain, and he is getting electrical shock and is having to take ibuprofen to get any relief. I reviewed the pt's medication orders and the hydrocodone was ordered yesterday. I asked pt how many of the hydrocodone he has taken. Pt states he received the medication today and had taken one dose. I told pt that he should take the hydrocodone as the prescription orders, and not allow the pain to get ahead of the medication, and he could take 800mg  Ibuprofen 3 times a day. Pt states he is concerned he may run out of the pain medication. I told pt that if he continued to need the pain medication, we would evaluate his symptoms and prescribe as necessary. I told pt to continue with resting, and icing and elevation when painful episodes occur. I told pt I would inform Dr. March Rummage of his current concerns and my instructions.

## 2018-10-12 NOTE — Telephone Encounter (Signed)
Copied from [place] [phone] [phone]. Topic: Quick Communication - See Telephone Encounter >> Oct 12, 2018  3:38 PM Rutherford Nail, [place] wrote: CRM for notification. See Telephone encounter for: 10/12/18. Patient's spouse calling and would like to know if Ronald Jackson could refill the medications that she prescribed for his rash on his legs? He had a visit with her on 08/09/2018 for her to look at the rash. Patient's girlfriend unsure which medications were prescribed. States that the rash has improved, but is still there and he is out of the medication. Would like to know what she would like to do at this point? Please advise.  CB#: [place], [place]

## 2018-10-12 NOTE — Telephone Encounter (Signed)
Please inform patient that medication for rash was not prescribed by me

## 2018-10-12 NOTE — Telephone Encounter (Signed)
[place] please advise, do not see anything given for rash beside referral to dermatology.

## 2018-10-13 NOTE — Telephone Encounter (Signed)
Advise the pt message below, also gave him dermatology phone #.

## 2018-10-13 NOTE — Telephone Encounter (Signed)
LVM for the pt to call back, pls check DPR.

## 2018-10-15 DIAGNOSIS — R269 Unspecified abnormalities of gait and mobility: Secondary | ICD-10-CM | POA: Diagnosis not present

## 2018-10-15 DIAGNOSIS — M25675 Stiffness of left foot, not elsewhere classified: Secondary | ICD-10-CM | POA: Diagnosis not present

## 2018-10-15 DIAGNOSIS — M25672 Stiffness of left ankle, not elsewhere classified: Secondary | ICD-10-CM | POA: Diagnosis not present

## 2018-10-15 DIAGNOSIS — M25572 Pain in left ankle and joints of left foot: Secondary | ICD-10-CM | POA: Diagnosis not present

## 2018-10-19 ENCOUNTER — Ambulatory Visit (INDEPENDENT_AMBULATORY_CARE_PROVIDER_SITE_OTHER): Payer: BLUE CROSS/BLUE SHIELD | Admitting: Podiatry

## 2018-10-19 ENCOUNTER — Encounter: Payer: Self-pay | Admitting: Podiatry

## 2018-10-19 ENCOUNTER — Other Ambulatory Visit: Payer: Self-pay

## 2018-10-19 ENCOUNTER — Ambulatory Visit (INDEPENDENT_AMBULATORY_CARE_PROVIDER_SITE_OTHER): Payer: BLUE CROSS/BLUE SHIELD

## 2018-10-19 VITALS — Temp 97.7°F

## 2018-10-19 DIAGNOSIS — M2042 Other hammer toe(s) (acquired), left foot: Secondary | ICD-10-CM

## 2018-10-19 DIAGNOSIS — M205X2 Other deformities of toe(s) (acquired), left foot: Secondary | ICD-10-CM | POA: Diagnosis not present

## 2018-10-19 DIAGNOSIS — R269 Unspecified abnormalities of gait and mobility: Secondary | ICD-10-CM | POA: Diagnosis not present

## 2018-10-19 DIAGNOSIS — Z9889 Other specified postprocedural states: Secondary | ICD-10-CM

## 2018-10-19 DIAGNOSIS — M25672 Stiffness of left ankle, not elsewhere classified: Secondary | ICD-10-CM | POA: Diagnosis not present

## 2018-10-19 DIAGNOSIS — M25572 Pain in left ankle and joints of left foot: Secondary | ICD-10-CM | POA: Diagnosis not present

## 2018-10-19 DIAGNOSIS — M25675 Stiffness of left foot, not elsewhere classified: Secondary | ICD-10-CM | POA: Diagnosis not present

## 2018-10-19 MED ORDER — HYDROCODONE-ACETAMINOPHEN 10-325 MG PO TABS: 1.0000 | ORAL_TABLET | Freq: Four times a day (QID) | ORAL | 0 refills | Status: AC | PRN

## 2018-10-19 MED ORDER — TERBINAFINE HCL 250 MG PO TABS: 250.0000 mg | ORAL_TABLET | Freq: Every day | ORAL | 0 refills | Status: DC

## 2018-10-20 NOTE — Progress Notes (Signed)
He presents today for another postop visit date of surgery August 27, 2018 he presents stating that he is doing much better he can at least get around more now.  He is status post Keller bunion implant with tenotomies toes 2 3 and 4 of the left foot.  Objective: Presents in his Darco shoe.  Objective: Great range of motion of the first metatarsal phalangeal joint of the left foot.  Patient getting better swelling appears to be going down particularly on x-ray.  Arthroplasty is completed in good position with implant.  Assessment: Well-healing surgical foot.  Plan: Continue physical therapy continue at home physical therapy as well and get back into a regular shoe to help break up some of the scar tissue.  We discussed massage therapy to help break up scar tissue as well.  I will follow-up with him in 1 more month

## 2018-10-26 DIAGNOSIS — R269 Unspecified abnormalities of gait and mobility: Secondary | ICD-10-CM | POA: Diagnosis not present

## 2018-10-26 DIAGNOSIS — M25572 Pain in left ankle and joints of left foot: Secondary | ICD-10-CM | POA: Diagnosis not present

## 2018-10-26 DIAGNOSIS — M25675 Stiffness of left foot, not elsewhere classified: Secondary | ICD-10-CM | POA: Diagnosis not present

## 2018-10-26 DIAGNOSIS — M25672 Stiffness of left ankle, not elsewhere classified: Secondary | ICD-10-CM | POA: Diagnosis not present

## 2018-10-28 DIAGNOSIS — M25572 Pain in left ankle and joints of left foot: Secondary | ICD-10-CM | POA: Diagnosis not present

## 2018-10-28 DIAGNOSIS — M25675 Stiffness of left foot, not elsewhere classified: Secondary | ICD-10-CM | POA: Diagnosis not present

## 2018-10-28 DIAGNOSIS — R269 Unspecified abnormalities of gait and mobility: Secondary | ICD-10-CM | POA: Diagnosis not present

## 2018-10-28 DIAGNOSIS — M25672 Stiffness of left ankle, not elsewhere classified: Secondary | ICD-10-CM | POA: Diagnosis not present

## 2018-11-02 DIAGNOSIS — R269 Unspecified abnormalities of gait and mobility: Secondary | ICD-10-CM | POA: Diagnosis not present

## 2018-11-02 DIAGNOSIS — M25675 Stiffness of left foot, not elsewhere classified: Secondary | ICD-10-CM | POA: Diagnosis not present

## 2018-11-02 DIAGNOSIS — M25672 Stiffness of left ankle, not elsewhere classified: Secondary | ICD-10-CM | POA: Diagnosis not present

## 2018-11-02 DIAGNOSIS — M25572 Pain in left ankle and joints of left foot: Secondary | ICD-10-CM | POA: Diagnosis not present

## 2018-11-03 ENCOUNTER — Telehealth: Payer: Self-pay | Admitting: *Deleted

## 2018-11-03 NOTE — Telephone Encounter (Signed)
Please extend his time out of work another six weeks.

## 2018-11-03 NOTE — Telephone Encounter (Signed)
Pt states he needs an extension on his short-term disability.

## 2018-11-05 DIAGNOSIS — M25572 Pain in left ankle and joints of left foot: Secondary | ICD-10-CM | POA: Diagnosis not present

## 2018-11-05 DIAGNOSIS — M25675 Stiffness of left foot, not elsewhere classified: Secondary | ICD-10-CM | POA: Diagnosis not present

## 2018-11-05 DIAGNOSIS — M25672 Stiffness of left ankle, not elsewhere classified: Secondary | ICD-10-CM | POA: Diagnosis not present

## 2018-11-05 DIAGNOSIS — R269 Unspecified abnormalities of gait and mobility: Secondary | ICD-10-CM | POA: Diagnosis not present

## 2018-11-10 DIAGNOSIS — R269 Unspecified abnormalities of gait and mobility: Secondary | ICD-10-CM | POA: Diagnosis not present

## 2018-11-10 DIAGNOSIS — M25572 Pain in left ankle and joints of left foot: Secondary | ICD-10-CM | POA: Diagnosis not present

## 2018-11-10 DIAGNOSIS — M25675 Stiffness of left foot, not elsewhere classified: Secondary | ICD-10-CM | POA: Diagnosis not present

## 2018-11-10 DIAGNOSIS — M25672 Stiffness of left ankle, not elsewhere classified: Secondary | ICD-10-CM | POA: Diagnosis not present

## 2018-11-11 ENCOUNTER — Telehealth: Payer: Self-pay | Admitting: Podiatry

## 2018-11-11 ENCOUNTER — Other Ambulatory Visit: Payer: Self-pay | Admitting: Podiatry

## 2018-11-11 MED ORDER — HYDROCODONE-ACETAMINOPHEN 10-325 MG PO TABS: 1.0000 | ORAL_TABLET | Freq: Four times a day (QID) | ORAL | 0 refills | Status: AC | PRN

## 2018-11-11 NOTE — Telephone Encounter (Signed)
I sent vicodin

## 2018-11-11 NOTE — Telephone Encounter (Signed)
I informed pt of the prescription sent to [facility].

## 2018-11-11 NOTE — Telephone Encounter (Signed)
Patient is requesting pain medication.

## 2018-11-12 DIAGNOSIS — M25572 Pain in left ankle and joints of left foot: Secondary | ICD-10-CM | POA: Diagnosis not present

## 2018-11-12 DIAGNOSIS — M25675 Stiffness of left foot, not elsewhere classified: Secondary | ICD-10-CM | POA: Diagnosis not present

## 2018-11-12 DIAGNOSIS — M25672 Stiffness of left ankle, not elsewhere classified: Secondary | ICD-10-CM | POA: Diagnosis not present

## 2018-11-12 DIAGNOSIS — R269 Unspecified abnormalities of gait and mobility: Secondary | ICD-10-CM | POA: Diagnosis not present

## 2018-11-16 DIAGNOSIS — M25675 Stiffness of left foot, not elsewhere classified: Secondary | ICD-10-CM | POA: Diagnosis not present

## 2018-11-16 DIAGNOSIS — M25572 Pain in left ankle and joints of left foot: Secondary | ICD-10-CM | POA: Diagnosis not present

## 2018-11-16 DIAGNOSIS — R269 Unspecified abnormalities of gait and mobility: Secondary | ICD-10-CM | POA: Diagnosis not present

## 2018-11-16 DIAGNOSIS — M25672 Stiffness of left ankle, not elsewhere classified: Secondary | ICD-10-CM | POA: Diagnosis not present

## 2018-11-18 ENCOUNTER — Ambulatory Visit: Payer: BC Managed Care – PPO

## 2018-11-18 ENCOUNTER — Encounter: Payer: BC Managed Care – PPO | Admitting: Podiatry

## 2018-11-18 DIAGNOSIS — M2042 Other hammer toe(s) (acquired), left foot: Secondary | ICD-10-CM

## 2018-11-18 DIAGNOSIS — M205X2 Other deformities of toe(s) (acquired), left foot: Secondary | ICD-10-CM

## 2018-11-18 NOTE — Progress Notes (Signed)
This encounter was created in error - please disregard.

## 2018-11-23 ENCOUNTER — Telehealth: Payer: Self-pay | Admitting: Podiatry

## 2018-11-23 DIAGNOSIS — R269 Unspecified abnormalities of gait and mobility: Secondary | ICD-10-CM | POA: Diagnosis not present

## 2018-11-23 DIAGNOSIS — M25572 Pain in left ankle and joints of left foot: Secondary | ICD-10-CM | POA: Diagnosis not present

## 2018-11-23 DIAGNOSIS — M25672 Stiffness of left ankle, not elsewhere classified: Secondary | ICD-10-CM | POA: Diagnosis not present

## 2018-11-23 DIAGNOSIS — M25675 Stiffness of left foot, not elsewhere classified: Secondary | ICD-10-CM | POA: Diagnosis not present

## 2018-11-23 NOTE — Telephone Encounter (Signed)
Pt called with questions about his STD. Stated he is supposed to be released this week to go back to work.

## 2018-11-25 ENCOUNTER — Ambulatory Visit (INDEPENDENT_AMBULATORY_CARE_PROVIDER_SITE_OTHER): Payer: BC Managed Care – PPO | Admitting: Podiatry

## 2018-11-25 ENCOUNTER — Other Ambulatory Visit: Payer: Self-pay

## 2018-11-25 ENCOUNTER — Ambulatory Visit (INDEPENDENT_AMBULATORY_CARE_PROVIDER_SITE_OTHER): Payer: BC Managed Care – PPO

## 2018-11-25 ENCOUNTER — Encounter: Payer: Self-pay | Admitting: Podiatry

## 2018-11-25 VITALS — Temp 98.2°F

## 2018-11-25 DIAGNOSIS — M205X2 Other deformities of toe(s) (acquired), left foot: Secondary | ICD-10-CM | POA: Diagnosis not present

## 2018-11-25 DIAGNOSIS — M2042 Other hammer toe(s) (acquired), left foot: Secondary | ICD-10-CM

## 2018-11-25 DIAGNOSIS — Z9889 Other specified postprocedural states: Secondary | ICD-10-CM

## 2018-11-25 MED ORDER — TERBINAFINE HCL 250 MG PO TABS: 250.0000 mg | ORAL_TABLET | Freq: Every day | ORAL | 0 refills | Status: DC

## 2018-11-25 NOTE — Progress Notes (Signed)
He presents today stating that he is doing much better feels the physical therapy and his working seems to be making the toe actually do a little bit better he states that the toenails were looking a little better and he is run out of medicine.  He denies any problems taking medicine no rashes no fever chills nausea vomiting muscle aches pains or itching.  Objective: He presents today wearing [employer]'s normal ambulating gait.  Once removed demonstrates a Keller arthroplasty single silicone implant performed August 27, 2018.  He has great range of motion of the first metatarsophalangeal joint.  Radiographs taken today demonstrate well-placed Keller arthroplasty with single silicone implant and grommets..  Assessment: Onychomycosis.  Well-healing surgical toe.  Plan: I feel that we can increase his work time about 6 hours out of the day.  I think 6 hours a day at work is quite enough at this point.  I am also going to write another prescription for 90 tablets worth of Lamisil.  I would like to follow-up with him in about 6 weeks and reconsider our time at work.

## 2018-12-07 DIAGNOSIS — M25672 Stiffness of left ankle, not elsewhere classified: Secondary | ICD-10-CM | POA: Diagnosis not present

## 2018-12-07 DIAGNOSIS — M25572 Pain in left ankle and joints of left foot: Secondary | ICD-10-CM | POA: Diagnosis not present

## 2018-12-07 DIAGNOSIS — M25675 Stiffness of left foot, not elsewhere classified: Secondary | ICD-10-CM | POA: Diagnosis not present

## 2018-12-07 DIAGNOSIS — R269 Unspecified abnormalities of gait and mobility: Secondary | ICD-10-CM | POA: Diagnosis not present

## 2018-12-09 DIAGNOSIS — R269 Unspecified abnormalities of gait and mobility: Secondary | ICD-10-CM | POA: Diagnosis not present

## 2018-12-09 DIAGNOSIS — M25572 Pain in left ankle and joints of left foot: Secondary | ICD-10-CM | POA: Diagnosis not present

## 2018-12-09 DIAGNOSIS — M25672 Stiffness of left ankle, not elsewhere classified: Secondary | ICD-10-CM | POA: Diagnosis not present

## 2018-12-09 DIAGNOSIS — M25675 Stiffness of left foot, not elsewhere classified: Secondary | ICD-10-CM | POA: Diagnosis not present

## 2018-12-17 ENCOUNTER — Telehealth: Payer: Self-pay | Admitting: Nurse Practitioner

## 2018-12-17 DIAGNOSIS — Z20822 Contact with and (suspected) exposure to covid-19: Secondary | ICD-10-CM

## 2018-12-17 NOTE — Telephone Encounter (Signed)
Pt.'s wife works for [employer] and she has been tested. One daughter has tested positive. Scheduled for Monday.

## 2018-12-20 ENCOUNTER — Other Ambulatory Visit: Payer: BLUE CROSS/BLUE SHIELD

## 2018-12-21 ENCOUNTER — Telehealth: Payer: Self-pay | Admitting: Nurse Practitioner

## 2018-12-21 NOTE — Telephone Encounter (Signed)
Called pt back because he is trying to reschedule CPE but he is awaiting results of covid test and knows he will need to wait until results come back

## 2018-12-25 LAB — NOVEL CORONAVIRUS, NAA: SARS-CoV-2, NAA: NOT DETECTED

## 2018-12-29 ENCOUNTER — Encounter: Payer: Self-pay | Admitting: Nurse Practitioner

## 2018-12-29 DIAGNOSIS — M542 Cervicalgia: Secondary | ICD-10-CM | POA: Diagnosis not present

## 2018-12-29 DIAGNOSIS — M79672 Pain in left foot: Secondary | ICD-10-CM | POA: Diagnosis not present

## 2018-12-29 DIAGNOSIS — M4722 Other spondylosis with radiculopathy, cervical region: Secondary | ICD-10-CM | POA: Diagnosis not present

## 2018-12-30 ENCOUNTER — Other Ambulatory Visit: Payer: Self-pay

## 2018-12-30 ENCOUNTER — Encounter: Payer: Self-pay | Admitting: Podiatry

## 2018-12-30 ENCOUNTER — Ambulatory Visit (INDEPENDENT_AMBULATORY_CARE_PROVIDER_SITE_OTHER): Payer: BC Managed Care – PPO | Admitting: Podiatry

## 2018-12-30 DIAGNOSIS — Z9889 Other specified postprocedural states: Secondary | ICD-10-CM | POA: Diagnosis not present

## 2018-12-30 DIAGNOSIS — M205X2 Other deformities of toe(s) (acquired), left foot: Secondary | ICD-10-CM | POA: Diagnosis not present

## 2018-12-30 DIAGNOSIS — M2042 Other hammer toe(s) (acquired), left foot: Secondary | ICD-10-CM | POA: Diagnosis not present

## 2019-01-01 ENCOUNTER — Encounter: Payer: Self-pay | Admitting: Podiatry

## 2019-01-01 NOTE — Progress Notes (Signed)
He presents today for follow-up of his onychomycosis he still taking Lamisil states that he is doing very well he still has 60 more tablets to take he states that his surgical foot is feeling a lot better he states that he needs a note for work to return to full duty  Objective: Vital signs are stable alert and oriented x3.  Pulses are palpable.  Surgical foot demonstrates minimal edema no erythema cellulitis drainage or odor great range of motion of the first metatarsal phalangeal joint.  Nail dystrophy and onychomycosis appears to be resolving slightly with at the very least a lightening of the discoloration.  We discussed personal debridement  Assessment: Well-healing surgical foot and onychomycosis.  Plan: Discussed etiology pathology conservative versus surgical therapies.  He will continue the next 60 days of Lamisil.  We will write a note for him to return to work.

## 2019-01-05 DIAGNOSIS — M542 Cervicalgia: Secondary | ICD-10-CM | POA: Diagnosis not present

## 2019-01-06 ENCOUNTER — Ambulatory Visit: Payer: BC Managed Care – PPO | Admitting: Podiatry

## 2019-01-06 ENCOUNTER — Encounter: Payer: BLUE CROSS/BLUE SHIELD | Admitting: Nurse Practitioner

## 2019-01-12 DIAGNOSIS — M79672 Pain in left foot: Secondary | ICD-10-CM | POA: Diagnosis not present

## 2019-03-31 ENCOUNTER — Ambulatory Visit: Payer: BC Managed Care – PPO | Admitting: Podiatry

## 2019-09-16 ENCOUNTER — Ambulatory Visit: Payer: Self-pay | Attending: Internal Medicine

## 2019-09-16 DIAGNOSIS — Z23 Encounter for immunization: Secondary | ICD-10-CM

## 2019-09-16 NOTE — Progress Notes (Signed)
   Covid-19 Vaccination Clinic  Name:  Ronald Jackson    MRN: [id number] DOB: 04-09-1981  09/16/2019  Mr. Ronald Jackson was observed post Covid-19 immunization for 15 minutes without incident. He was provided with Vaccine Information Sheet and instruction to access the V-Safe system.   Mr. Ronald Jackson was instructed to call 911 with any severe reactions post vaccine: Marland Kitchen Difficulty breathing  . Swelling of face and throat  . A fast heartbeat  . A bad rash all over body  . Dizziness and weakness   Immunizations Administered    Name Date Dose VIS Date Route   Pfizer COVID-19 Vaccine 09/16/2019  8:22 AM 0.3 mL 05/27/2019 Intramuscular   Manufacturer: [employer], [employer]   Lot: [id number]   [place]: [id number]

## 2019-10-10 ENCOUNTER — Ambulatory Visit: Payer: Self-pay

## 2019-10-11 ENCOUNTER — Encounter: Payer: BC Managed Care – PPO | Admitting: Podiatry

## 2019-10-11 ENCOUNTER — Ambulatory Visit: Payer: BC Managed Care – PPO

## 2019-10-11 DIAGNOSIS — M778 Other enthesopathies, not elsewhere classified: Secondary | ICD-10-CM

## 2019-10-12 ENCOUNTER — Ambulatory Visit: Payer: Self-pay | Attending: Internal Medicine

## 2019-10-12 DIAGNOSIS — Z23 Encounter for immunization: Secondary | ICD-10-CM

## 2019-10-12 NOTE — Progress Notes (Signed)
   Covid-19 Vaccination Clinic  Name:  Ronald Jackson    MRN: [id number] DOB: [birth year]  10/12/2019  [name] was observed post Covid-19 immunization for 15 minutes without incident. He was provided with Vaccine Information Sheet and instruction to access the V-Safe system.   [name] was instructed to call 911 with any severe reactions post vaccine: Marland Kitchen Difficulty breathing  . Swelling of face and throat  . A fast heartbeat  . A bad rash all over body  . Dizziness and weakness   Immunizations Administered    Name Date Dose VIS Date Route   Pfizer COVID-19 Vaccine 10/12/2019  8:46 AM 0.3 mL 08/10/2018 Intramuscular   Manufacturer: [place]   Lot: [id number]   [place]: [id number]

## 2019-10-13 ENCOUNTER — Ambulatory Visit: Payer: Self-pay

## 2019-10-19 ENCOUNTER — Other Ambulatory Visit: Payer: Self-pay

## 2019-10-20 ENCOUNTER — Ambulatory Visit (INDEPENDENT_AMBULATORY_CARE_PROVIDER_SITE_OTHER): Payer: BC Managed Care – PPO | Admitting: Nurse Practitioner

## 2019-10-20 ENCOUNTER — Encounter: Payer: Self-pay | Admitting: Nurse Practitioner

## 2019-10-20 ENCOUNTER — Other Ambulatory Visit: Payer: Self-pay | Admitting: Podiatry

## 2019-10-20 VITALS — BP 128/78 | HR 81 | Temp 97.6°F | Ht 75.0 in | Wt 221.8 lb

## 2019-10-20 DIAGNOSIS — Z13220 Encounter for screening for lipoid disorders: Secondary | ICD-10-CM

## 2019-10-20 DIAGNOSIS — Z716 Tobacco abuse counseling: Secondary | ICD-10-CM | POA: Diagnosis not present

## 2019-10-20 DIAGNOSIS — Z136 Encounter for screening for cardiovascular disorders: Secondary | ICD-10-CM | POA: Diagnosis not present

## 2019-10-20 DIAGNOSIS — Z0000 Encounter for general adult medical examination without abnormal findings: Secondary | ICD-10-CM | POA: Diagnosis not present

## 2019-10-20 LAB — CBC
HCT: 42.1 % (ref 39.0–52.0)
Hemoglobin: 14.2 g/dL (ref 13.0–17.0)
MCHC: 33.8 g/dL (ref 30.0–36.0)
MCV: 87.8 fl (ref 78.0–100.0)
Platelets: 166 10*3/uL (ref 150.0–400.0)
RBC: 4.8 Mil/uL (ref 4.22–5.81)
RDW: 15 % (ref 11.5–15.5)
WBC: 7.5 10*3/uL (ref 4.0–10.5)

## 2019-10-20 LAB — LIPID PANEL
Cholesterol: 174 mg/dL (ref 0–200)
HDL: 41.3 mg/dL (ref >39.00)
LDL Cholesterol: 126 mg/dL — ABNORMAL HIGH (ref 0–99)
NonHDL: 133.05
Total CHOL/HDL Ratio: 4
Triglycerides: 35 mg/dL (ref 0.0–149.0)
VLDL: 7 mg/dL (ref 0.0–40.0)

## 2019-10-20 LAB — COMPREHENSIVE METABOLIC PANEL
ALT: 13 U/L (ref 0–53)
AST: 21 U/L (ref 0–37)
Albumin: 4.6 g/dL (ref 3.5–5.2)
Alkaline Phosphatase: 43 U/L (ref 39–117)
BUN: 16 mg/dL (ref 6–23)
CO2: 29 mEq/L (ref 19–32)
Calcium: 9.7 mg/dL (ref 8.4–10.5)
Chloride: 104 mEq/L (ref 96–112)
Creatinine, Ser: 1.18 mg/dL (ref 0.40–1.50)
GFR: 83.04 mL/min (ref >60.00)
Glucose, Bld: 93 mg/dL (ref 70–99)
Potassium: 4.1 mEq/L (ref 3.5–5.1)
Sodium: 137 mEq/L (ref 135–145)
Total Bilirubin: 0.9 mg/dL (ref 0.2–1.2)
Total Protein: 7.7 g/dL (ref 6.0–8.3)

## 2019-10-20 LAB — TSH: TSH: 0.35 u[IU]/mL (ref 0.35–4.50)

## 2019-10-20 MED ORDER — BUPROPION HCL ER (SR) 150 MG PO TB12: ORAL_TABLET | ORAL | 1 refills | Status: DC

## 2019-10-20 NOTE — Patient Instructions (Signed)
Pick a quit date, start zyban 1week prior to that date. F/up in 39month  Go to lab for blood draw. Maintain DASH diet and daily exercise.  Bupropion sustained-release tablets (smoking cessation) What is this medicine? BUPROPION (byoo PROE pee on) is used to help people quit smoking. This medicine may be used for other purposes; ask your health care provider or pharmacist if you have questions. COMMON BRAND NAME(S): Buproban, Zyban What should I tell my health care provider before I take this medicine? They need to know if you have any of these conditions:  an eating disorder, such as anorexia or bulimia  bipolar disorder or psychosis  diabetes or high blood sugar, treated with medication  glaucoma  head injury or brain tumor  heart disease, previous heart attack, or irregular heart beat  high blood pressure  kidney or liver disease  seizures  suicidal thoughts or a previous suicide attempt  Tourette's syndrome  weight loss  an unusual or allergic reaction to bupropion, other medicines, foods, dyes, or preservatives  breast-feeding  pregnant or trying to become pregnant How should I use this medicine? Take this medicine by mouth with a glass of water. Follow the directions on the prescription label. You can take it with or without food. If it upsets your stomach, take it with food. Do not cut, crush or chew this medicine. Take your medicine at regular intervals. If you take this medicine more than once a day, take your second dose at least 8 hours after you take your first dose. To limit difficulty in sleeping, avoid taking this medicine at bedtime. Do not take your medicine more often than directed. Do not stop taking this medicine suddenly except upon the advice of your doctor. Stopping this medicine too quickly may cause serious side effects. A special MedGuide will be given to you by the pharmacist with each prescription and refill. Be sure to read this information  carefully each time. Talk to your pediatrician regarding the use of this medicine in children. Special care may be needed. Overdosage: If you think you have taken too much of this medicine contact a poison control center or emergency room at once. NOTE: This medicine is only for you. Do not share this medicine with others. What if I miss a dose? If you miss a dose, skip the missed dose and take your next tablet at the regular time. There should be at least 8 hours between doses. Do not take double or extra doses. What may interact with this medicine? Do not take this medicine with any of the following medications:  linezolid  MAOIs like Azilect, Carbex, Eldepryl, Marplan, Nardil, and Parnate  methylene blue (injected into a vein)  other medicines that contain bupropion like Wellbutrin This medicine may also interact with the following medications:  alcohol  certain medicines for anxiety or sleep  certain medicines for blood pressure like metoprolol, propranolol  certain medicines for depression or psychotic disturbances  certain medicines for HIV or AIDS like efavirenz, lopinavir, nelfinavir, ritonavir  certain medicines for irregular heart beat like propafenone, flecainide  certain medicines for Parkinson's disease like amantadine, levodopa  certain medicines for seizures like carbamazepine, phenytoin, phenobarbital  cimetidine  clopidogrel  cyclophosphamide  digoxin  furazolidone  isoniazid  nicotine  orphenadrine  procarbazine  steroid medicines like prednisone or cortisone  stimulant medicines for attention disorders, weight loss, or to stay awake  tamoxifen  theophylline  thiotepa  ticlopidine  tramadol  warfarin This list may not describe  all possible interactions. Give your health care provider a list of all the medicines, herbs, non-prescription drugs, or dietary supplements you use. Also tell them if you smoke, drink alcohol, or use illegal  drugs. Some items may interact with your medicine. What should I watch for while using this medicine? Visit your doctor or healthcare provider for regular checks on your progress. This medicine should be used together with a patient support program. It is important to participate in a behavioral program, counseling, or other support program that is recommended by your healthcare provider. This medicine may cause serious skin reactions. They can happen weeks to months after starting the medicine. Contact your healthcare provider right away if you notice fevers or flu-like symptoms with a rash. The rash may be red or purple and then turn into blisters or peeling of the skin. Or, you might notice a red rash with swelling of the face, lips or lymph nodes in your neck or under your arms. Patients and their families should watch out for new or worsening thoughts of suicide or depression. Also watch out for sudden changes in feelings such as feeling anxious, agitated, panicky, irritable, hostile, aggressive, impulsive, severely restless, overly excited and hyperactive, or not being able to sleep. If this happens, especially at the beginning of treatment or after a change in dose, call your healthcare provider. Avoid alcoholic drinks while taking this medicine. Drinking excessive alcoholic beverages, using sleeping or anxiety medicines, or quickly stopping the use of these agents while taking this medicine may increase your risk for a seizure. Do not drive or use heavy machinery until you know how this medicine affects you. This medicine can impair your ability to perform these tasks. Do not take this medicine close to bedtime. It may prevent you from sleeping. Your mouth may get dry. Chewing sugarless gum or sucking hard candy, and drinking plenty of water may help. Contact your doctor if the problem does not go away or is severe. Do not use nicotine patches or chewing gum without the advice of your doctor or  healthcare provider while taking this medicine. You may need to have your blood pressure taken regularly if your doctor recommends that you use both nicotine and this medicine together. What side effects may I notice from receiving this medicine? Side effects that you should report to your doctor or health care professional as soon as possible:  allergic reactions like skin rash, itching or hives, swelling of the face, lips, or tongue  breathing problems  changes in vision  confusion  elevated mood, decreased need for sleep, racing thoughts, impulsive behavior  fast or irregular heartbeat  hallucinations, loss of contact with reality  increased blood pressure  rash, fever, and swollen lymph nodes  redness, blistering, peeling, or loosening of the skin, including inside the mouth  seizures  suicidal thoughts or other mood changes  unusually weak or tired  vomiting Side effects that usually do not require medical attention (report to your doctor or health care professional if they continue or are bothersome):  constipation  headache  loss of appetite  nausea  tremors  weight loss This list may not describe all possible side effects. Call your doctor for medical advice about side effects. You may report side effects to FDA at 1-800-FDA-1088. Where should I keep my medicine? Keep out of the reach of children. Store at room temperature between 20 and 25 degrees C (68 and 77 degrees F). Protect from light. Keep container tightly closed. Throw  away any unused medicine after the expiration date. NOTE: This sheet is a summary. It may not cover all possible information. If you have questions about this medicine, talk to your doctor, pharmacist, or health care provider.  2020 Elsevier/Gold Standard (2018-08-26 13:59:09)

## 2019-10-20 NOTE — Progress Notes (Signed)
Subjective:    Patient ID: Ronald Jackson, male    DOB: 25-Jun-1980, 39 y.o.   MRN: [id number]  Patient presents today for complete physical and discuss tobacco cessation.  Nicotine Dependence Presents for initial visit. Symptoms include cravings and irritability. Symptoms are negative for decreased concentration, fatigue, headache, insomnia and sore throat. Preferred tobacco types include cigarettes. Preferred cigarette types include filtered. Preferred strength is regular. Preferred cigarettes are menthol. Preferred brands include Marlboro. His urge triggers include company of smokers, contact with substance, driving and stress. Risk factors do not include decrease in perceived risk.His first smoke is from 8 to 10 AM. Ronald Jackson smokes < 1/2 a pack of cigarettes per day. Ronald Jackson started smoking when Ronald Jackson was 26-18 years old. Past treatments include nothing. Ronald Jackson is ready to quit. Ronald Jackson has tried to quit 0 times. There is no history of alcohol abuse. (Marijuana use)   Sexual History (orientation,birth control, marital status, STD):marriend, sexually active  Depression/Suicide: Depression screen [facility] 2/9 10/20/2019 08/10/2018  Decreased Interest 0 0  Down, Depressed, Hopeless 0 0  PHQ - 2 Score 0 0   Vision:not needed  Dental:will schedule  Immunizations: (TDAP, Hep C screen, Pneumovax, Influenza, zoster)  Health Maintenance  Topic Date Due  . HIV Screening  Never done  . Tetanus Vaccine  Never done  . Flu Shot  01/15/2020  . COVID-19 Vaccine  Completed   Diet:regular.  Weight:  Wt Readings from Last 3 Encounters:  10/20/19 221 lb 12.8 oz (100.6 kg)  08/10/18 218 lb (98.9 kg)  08/04/18 210 lb (95.3 kg)    Exercise:none  Fall Risk: Fall Risk  10/20/2019 08/10/2018  Falls in the past year? 0 0  Number falls in past yr: 0 -  Injury with Fall? 0 -   Advanced Directive: Advanced Directives 08/04/2018  Does Patient Have a Medical Advance Directive? No  Would patient like information on  creating a medical advance directive? No - Patient declined    Medications and allergies reviewed with patient and updated if appropriate.  Patient Active Problem List   Diagnosis Date Noted  . Tobacco use 08/10/2018  . Skin lesion of lower extremity 08/10/2018  . Right leg pain 01/02/2014    Current Outpatient Medications on File Prior to Visit  Medication Sig Dispense Refill  . cetirizine (ZYRTEC) 10 MG tablet Take 1 tablet (10 mg total) by mouth at bedtime. [id number]    . fluticasone (FLONASE) 50 MCG/ACT nasal spray Place 2 sprays into both nostrils daily. 16 g 0  . guaiFENesin (MUCINEX) 600 MG 12 hr tablet Take 1 tablet (600 mg total) by mouth 2 (two) times daily as needed for cough or to loosen phlegm. 14 tablet 0  . terbinafine (LAMISIL) 250 MG tablet Take 1 tablet (250 mg total) by mouth daily. (Patient not taking: Reported on 10/20/2019) 90 tablet 0   No current facility-administered medications on file prior to visit.    Past Medical History:  Diagnosis Date  . Migraine     Past Surgical History:  Procedure Laterality Date  . DENTAL SURGERY      Social History   Socioeconomic History  . Marital status: Married    Spouse name: Not on file  . Number of children: Not on file  . Years of education: Not on file  . Highest education level: Not on file  Occupational History  . Not on file  Tobacco Use  . Smoking status: Current Every Day Smoker    Packs/day: 0.50  Types: Cigarettes  . Smokeless tobacco: Never Used  . Tobacco comment: Menthols  Substance and Sexual Activity  . Alcohol use: Yes    Comment: socially  . Drug use: Yes    Types: Marijuana    Comment: 2x/month  . Sexual activity: Yes  Other Topics Concern  . Not on file  Social History Narrative  . Not on file   Social Determinants of Health   Financial Resource Strain:   . Difficulty of Paying Living Expenses:   Food Insecurity:   . Worried About Charity fundraiser in the Last Year:   . Arts development officer in the Last Year:   Transportation Needs:   . Film/video editor (Medical):   Marland Kitchen Lack of Transportation (Non-Medical):   Physical Activity:   . Days of Exercise per Week:   . Minutes of Exercise per Session:   Stress:   . Feeling of Stress :   Social Connections:   . Frequency of Communication with Friends and Family:   . Frequency of Social Gatherings with Friends and Family:   . Attends Religious Services:   . Active Member of Clubs or Organizations:   . Attends Archivist Meetings:   Marland Kitchen Marital Status:     Family History  Problem Relation Age of Onset  . Diabetes Mother   . Drug abuse Father   . Early death Father   . Diabetes Brother   . Heart attack Maternal Grandmother   . Alcohol abuse Maternal Grandfather   . Alcohol abuse Paternal Grandfather         Review of Systems  Constitutional: Positive for irritability. Negative for fatigue, fever, malaise/fatigue and weight loss.  HENT: Negative for congestion and sore throat.   Eyes:       Negative for visual changes  Respiratory: Negative for cough and shortness of breath.   Cardiovascular: Negative for chest pain, palpitations and leg swelling.  Gastrointestinal: Negative for blood in stool, constipation, diarrhea and heartburn.  Genitourinary: Negative for dysuria, frequency and urgency.  Musculoskeletal: Negative for falls, joint pain and myalgias.  Skin: Negative for rash.  Neurological: Negative for dizziness, sensory change and headaches.  Endo/Heme/Allergies: Does not bruise/bleed easily.  Psychiatric/Behavioral: Negative for decreased concentration, depression, substance abuse and suicidal ideas. The patient is not nervous/anxious and does not have insomnia.     Objective:   Vitals:   10/20/19 1017  BP: 128/78  Pulse: 81  Temp: 97.6 F (36.4 C)  SpO2: 97%    Body mass index is 27.72 kg/m.   Physical Examination:  Physical Exam Vitals reviewed.  Constitutional:       General: Ronald Jackson is not in acute distress.    Appearance: Ronald Jackson is well-developed.  HENT:     Right Ear: Tympanic membrane, ear canal and external ear normal.     Left Ear: Tympanic membrane, ear canal and external ear normal.     Mouth/Throat:     Pharynx: No oropharyngeal exudate.  Eyes:     Extraocular Movements: Extraocular movements intact.     Conjunctiva/sclera: Conjunctivae normal.  Cardiovascular:     Rate and Rhythm: Normal rate and regular rhythm.     Heart sounds: Normal heart sounds.  Pulmonary:     Effort: Pulmonary effort is normal. No respiratory distress.     Breath sounds: Normal breath sounds.  Chest:     Chest wall: No tenderness.  Abdominal:     General: Bowel sounds are normal.  Palpations: Abdomen is soft.  Musculoskeletal:        General: Normal range of motion.     Cervical back: Normal range of motion and neck supple.     Right lower leg: No edema.     Left lower leg: No edema.  Lymphadenopathy:     Cervical: No cervical adenopathy.  Neurological:     Mental Status: Ronald Jackson is alert and oriented to person, place, and time.     Deep Tendon Reflexes: Reflexes are normal and symmetric.  Psychiatric:        Mood and Affect: Mood normal.        Behavior: Behavior normal.        Thought Content: Thought content normal.        Judgment: Judgment normal.     ASSESSMENT and PLAN: This visit occurred during the SARS-CoV-2 public health emergency.  Safety protocols were in place, including screening questions prior to the visit, additional usage of staff PPE, and extensive cleaning of exam room while observing appropriate contact time as indicated for disinfecting solutions.   Celeste was seen today for annual exam.  Diagnoses and all orders for this visit:  Preventative health care -     CBC -     Comprehensive metabolic panel -     Lipid panel -     TSH  Encounter for tobacco use cessation counseling -     buPROPion (WELLBUTRIN SR) 150 MG 12 hr tablet; Take  1tab daily x7days, then 1tab in Am and PM continuous x 12weeks.  Encounter for lipid screening for cardiovascular disease -     Lipid panel    No problem-specific Assessment & Plan notes found for this encounter.      Problem List Items Addressed This Visit      Other   Tobacco use   Relevant Medications   buPROPion (WELLBUTRIN SR) 150 MG 12 hr tablet    Other Visit Diagnoses    Preventative health care    -  Primary   Relevant Orders   CBC (Completed)   Comprehensive metabolic panel (Completed)   Lipid panel (Completed)   TSH (Completed)   Encounter for lipid screening for cardiovascular disease       Relevant Orders   Lipid panel (Completed)       Follow up: Return in about 4 weeks (around 11/17/2019) for tobacco cessation: use of zyban (video).  [name], NP

## 2019-10-21 ENCOUNTER — Encounter: Payer: Self-pay | Admitting: Nurse Practitioner

## 2019-11-03 ENCOUNTER — Encounter: Payer: Self-pay | Admitting: Nurse Practitioner

## 2019-11-03 ENCOUNTER — Encounter: Payer: Self-pay | Admitting: Podiatry

## 2019-11-03 DIAGNOSIS — J0140 Acute pansinusitis, unspecified: Secondary | ICD-10-CM

## 2019-11-04 MED ORDER — FLUTICASONE PROPIONATE 50 MCG/ACT NA SUSP: 2.0000 | Freq: Every day | NASAL | 0 refills | Status: DC

## 2019-11-07 NOTE — Progress Notes (Signed)
This encounter was created in error - please disregard.

## 2019-11-10 ENCOUNTER — Telehealth: Payer: Self-pay | Admitting: Podiatry

## 2019-11-10 NOTE — Telephone Encounter (Signed)
Left voicemail for patient to call back and schedule an appointment to be seen.

## 2019-11-28 ENCOUNTER — Encounter (HOSPITAL_COMMUNITY): Payer: Self-pay

## 2019-11-28 ENCOUNTER — Other Ambulatory Visit: Payer: Self-pay

## 2019-11-28 ENCOUNTER — Encounter: Payer: Self-pay | Admitting: Nurse Practitioner

## 2019-11-28 DIAGNOSIS — M62830 Muscle spasm of back: Secondary | ICD-10-CM | POA: Insufficient documentation

## 2019-11-28 DIAGNOSIS — Y9289 Other specified places as the place of occurrence of the external cause: Secondary | ICD-10-CM | POA: Insufficient documentation

## 2019-11-28 DIAGNOSIS — Y9389 Activity, other specified: Secondary | ICD-10-CM | POA: Diagnosis not present

## 2019-11-28 DIAGNOSIS — X503XXA Overexertion from repetitive movements, initial encounter: Secondary | ICD-10-CM | POA: Diagnosis not present

## 2019-11-28 DIAGNOSIS — F17210 Nicotine dependence, cigarettes, uncomplicated: Secondary | ICD-10-CM | POA: Diagnosis not present

## 2019-11-28 DIAGNOSIS — M542 Cervicalgia: Secondary | ICD-10-CM | POA: Diagnosis not present

## 2019-11-28 DIAGNOSIS — M25511 Pain in right shoulder: Secondary | ICD-10-CM

## 2019-11-28 DIAGNOSIS — M62838 Other muscle spasm: Secondary | ICD-10-CM | POA: Diagnosis not present

## 2019-11-28 DIAGNOSIS — Y990 Civilian activity done for income or pay: Secondary | ICD-10-CM | POA: Diagnosis not present

## 2019-11-28 NOTE — ED Triage Notes (Signed)
Patient arrived stating about three days ago he woke up and was unable to lift his head up and when he does it causes right shoulder and arm pain. Declines any injury. Reports taking a muscle relaxer with no relief.   Patient able to lift head in triage, just states it causes pain.

## 2019-11-29 ENCOUNTER — Emergency Department (HOSPITAL_COMMUNITY)
Admission: EM | Admit: 2019-11-29 | Discharge: 2019-11-29 | Disposition: A | Discharge status: Home / Self Care | Payer: BC Managed Care – PPO | Attending: Emergency Medicine | Admitting: Emergency Medicine

## 2019-11-29 DIAGNOSIS — M542 Cervicalgia: Secondary | ICD-10-CM

## 2019-11-29 DIAGNOSIS — M62838 Other muscle spasm: Secondary | ICD-10-CM

## 2019-11-29 MED ORDER — CYCLOBENZAPRINE HCL 10 MG PO TABS
5.0000 mg | ORAL_TABLET | Freq: Once | ORAL | Status: AC
  Administered 2019-11-29: 5 mg via ORAL
  Filled 2019-11-29: qty 1

## 2019-11-29 MED ORDER — HYDROCODONE-ACETAMINOPHEN 5-325 MG PO TABS
1.0000 | ORAL_TABLET | Freq: Once | ORAL | Status: AC
  Administered 2019-11-29: 1 via ORAL
  Filled 2019-11-29: qty 1

## 2019-11-29 MED ORDER — KETOROLAC TROMETHAMINE 30 MG/ML IJ SOLN
30.0000 mg | Freq: Once | INTRAMUSCULAR | Status: AC
  Administered 2019-11-29: 30 mg via INTRAMUSCULAR
  Filled 2019-11-29: qty 1

## 2019-11-29 MED ORDER — HYDROCODONE-ACETAMINOPHEN 5-325 MG PO TABS: 1.0000 | ORAL_TABLET | ORAL | 0 refills | Status: DC | PRN

## 2019-11-29 MED ORDER — LIDOCAINE 5 % EX PTCH: 1.0000 | MEDICATED_PATCH | CUTANEOUS | 0 refills | Status: DC

## 2019-11-29 MED ORDER — PREDNISONE 50 MG PO TABS
50.0000 mg | ORAL_TABLET | Freq: Every day | ORAL | 0 refills | Status: DC
Start: 2019-11-29 — End: 2019-12-13

## 2019-11-29 MED ORDER — CYCLOBENZAPRINE HCL 10 MG PO TABS
10.0000 mg | ORAL_TABLET | Freq: Two times a day (BID) | ORAL | 0 refills | Status: DC | PRN
Start: 2019-11-29 — End: 2019-12-13

## 2019-11-29 NOTE — ED Provider Notes (Signed)
[name] Provider Note   [name]: [id number] Arrival date & time: 11/28/19  2235    History Chief Complaint  Patient presents with  . Neck Pain    Ronald Jackson is a 39 y.o. male with no significant past medical history who presents for evaluation of right shoulder pain. Pain began 4 days ago. Does repetitive motions at work. Pain radiate from posterior right shoulder, trapezius into his poster right cervical region. Pain worse with overhead motion or movement to right arm. Has been taking Mobic and gabapentin without relief. Pain rated at 9/10. Has to come home early from work today 2/2 pain. No recent injury or trauma.  Denies headache, lightheadedness, dizziness, chest pain, shortness of breath, weakness, paresthesias.  Denies additional aggravating or relieving factors.  History obtained from patient and past medical records.  No interpreter used.  HPI     Past Medical History:  Diagnosis Date  . Migraine     Patient Active Problem List   Diagnosis Date Noted  . Tobacco use 08/10/2018  . Skin lesion of lower extremity 08/10/2018  . Right leg pain 01/02/2014    Past Surgical History:  Procedure Laterality Date  . DENTAL SURGERY         Family History  Problem Relation Age of Onset  . Diabetes Mother   . Drug abuse Father   . Early death Father   . Diabetes Brother   . Heart attack Maternal Grandmother   . Alcohol abuse Maternal Grandfather   . Alcohol abuse Paternal Grandfather     Social History   Tobacco Use  . Smoking status: Current Every Day Smoker    Packs/day: 0.50    Types: Cigarettes  . Smokeless tobacco: Never Used  . Tobacco comment: Menthols  Vaping Use  . Vaping Use: Never used  Substance Use Topics  . Alcohol use: Yes    Comment: socially  . Drug use: Yes    Types: Marijuana    Comment: 2x/month    Home Medications Prior to Admission medications   Medication Sig Start Date End Date Taking?  Authorizing Provider  buPROPion (WELLBUTRIN SR) 150 MG 12 hr tablet Take 1tab daily x7days, then 1tab in Am and PM continuous x 12weeks. 10/20/19   Nche, Charlene Brooke, NP  cetirizine (ZYRTEC) 10 MG tablet Take 1 tablet (10 mg total) by mouth at bedtime. [id number] 08/10/18   Nche, Charlene Brooke, NP  fluticasone (FLONASE) 50 MCG/ACT nasal spray Place 2 sprays into both nostrils daily. 11/04/19   Nche, Charlene Brooke, NP  guaiFENesin (MUCINEX) 600 MG 12 hr tablet Take 1 tablet (600 mg total) by mouth 2 (two) times daily as needed for cough or to loosen phlegm. 08/10/18   Nche, Charlene Brooke, NP  terbinafine (LAMISIL) 250 MG tablet Take 1 tablet (250 mg total) by mouth daily. Patient not taking: Reported on 10/20/2019 11/25/18   Garrel Ridgel, DPM    Allergies    Patient has no known allergies.  Review of Systems   Review of Systems  Constitutional: Negative.   HENT: Negative.   Respiratory: Negative.   Cardiovascular: Negative.   Gastrointestinal: Negative.   Genitourinary: Negative.   Musculoskeletal: Positive for neck pain. Negative for arthralgias, back pain, gait problem, joint swelling, myalgias and neck stiffness.       Right shoulder pain  Skin: Negative.   Neurological: Negative.   All other systems reviewed and are negative.   Physical Exam Updated Vital Signs BP Marland Kitchen)  140/107 (BP Location: Left Arm)   Pulse (!) 58   Temp 97.9 F (36.6 C) (Oral)   Resp 20   SpO2 100%   Physical Exam Vitals and nursing note reviewed.  Constitutional:      General: He is not in acute distress.    Appearance: He is well-developed. He is not ill-appearing, toxic-appearing or diaphoretic.  HENT:     Head: Normocephalic and atraumatic.     Nose: Nose normal.     Mouth/Throat:     Mouth: Mucous membranes are moist.  Eyes:     Pupils: Pupils are equal, round, and reactive to light.  Neck:     Trachea: Trachea and phonation normal.     Meningeal: Brudzinski's sign and Kernig's sign absent.       Comments: Diffuse tenderness to right trapezius and into right posterior shoulder.  He has pain with some movement to his cervical region however he has no meningismus.  He is able to look up towards the ceiling as well as chin to chest without difficulty.  Cardiovascular:     Rate and Rhythm: Normal rate and regular rhythm.     Pulses: Normal pulses.     Heart sounds: Normal heart sounds.  Pulmonary:     Effort: Pulmonary effort is normal. No respiratory distress.     Breath sounds: Normal breath sounds.  Abdominal:     General: Bowel sounds are normal. There is no distension.     Palpations: Abdomen is soft.     Tenderness: There is no abdominal tenderness. There is no left CVA tenderness, guarding or rebound.  Musculoskeletal:        General: Normal range of motion.     Right shoulder: Tenderness present. No swelling, deformity, effusion, laceration, bony tenderness or crepitus. Normal range of motion. Normal strength. Normal pulse.     Left shoulder: Normal.     Right upper arm: Normal.     Left upper arm: Normal.     Right elbow: Normal.     Left elbow: Normal.     Right forearm: Normal.     Left forearm: Normal.     Cervical back: Normal range of motion and neck supple. Pain with movement and muscular tenderness present. No spinous process tenderness.     Thoracic back: Normal.     Lumbar back: Normal.       Back:     Comments: No midline spinal tenderness, step offs. Diffuse tenderness over right trapezius. Pain with over head movement to right shoulder. Negative Hawking, empty can.   Lymphadenopathy:     Cervical: No cervical adenopathy.  Skin:    General: Skin is warm and dry.     Capillary Refill: Capillary refill takes less than 2 seconds.     Comments: No edema, erythema or warmth.  Neurological:     Mental Status: He is alert.     Comments: Mental Status:  Alert, oriented, thought content appropriate. Speech fluent without evidence of aphasia. Able to follow 2 step  commands without difficulty.  Cranial Nerves:  II:  Peripheral visual fields grossly normal, pupils equal, round, reactive to light III,IV, VI: ptosis not present, extra-ocular motions intact bilaterally  V,VII: smile symmetric, facial light touch sensation equal VIII: hearing grossly normal bilaterally  IX,X: midline uvula rise  XI: bilateral shoulder shrug equal and strong XII: midline tongue extension  Motor:  5/5 in upper and lower extremities bilaterally including strong and equal grip strength and dorsiflexion/plantar flexion  Sensory: Pinprick and light touch normal in all extremities.  Deep Tendon Reflexes: 2+ and symmetric  Cerebellar: normal finger-to-nose with bilateral upper extremities Gait: normal gait and balance CV: distal pulses palpable throughout       ED Results / Procedures / Treatments   Labs (all labs ordered are listed, but only abnormal results are displayed) Labs Reviewed - No data to display  EKG None  Radiology No results found.  Procedures Procedures (including critical care time)  Medications Ordered in ED Medications  ketorolac (TORADOL) 30 MG/ML injection 30 mg (30 mg Intramuscular Given 11/29/19 0323)  HYDROcodone-acetaminophen (NORCO/VICODIN) 5-325 MG per tablet 1 tablet (1 tablet Oral Given 11/29/19 0323)  cyclobenzaprine (FLEXERIL) tablet 5 mg (5 mg Oral Given 11/29/19 0109)    ED Course  I have reviewed the triage vital signs and the nursing notes.  Pertinent labs & imaging results that were available during my care of the patient were reviewed by me and considered in my medical decision making (see chart for details).  39 year old male presents for evaluation of right shoulder pain which extends into his neck.  He is afebrile, nonseptic, not ill-appearing.  He does do repetitive motions at work.  He has been on moving gabapentin without relief.  He is neurovascularly intact.  He does have diffuse tenderness with spasms to his right  trapezius.  He has no midline spinal tenderness.  He has no meningismus.  Low suspicion for meningitis.  No deficits with good handgrip.  He has pain with overhead range of motion to his right shoulder however negative Hawkins, empty can.  No recent injury or trauma.  No surrounding edema, erythema or warmth.  Low suspicion for septic joint, gout, hemarthrosis.  No pain over lateral neck,  low suspicion for dissection, atypical cardiac or pulmonary etiology.  Unable to reproduce his pain on exam.  Do not feel he needs imaging at this time.  Will treat symptomatically and have him follow-up outpatient with orthopedics.  Does have a ride home.  Patient reassessed. Significant improvement with meds here in ED. Full ROM cervical spine without difficulty. Will dc home with symptomatic management and have her follow-up outpatient with orthopedics.   The patient has been appropriately medically screened and/or stabilized in the ED. I have low suspicion for any other emergent medical condition which would require further screening, evaluation or treatment in the ED or require inpatient management.  Patient is hemodynamically stable and in no acute distress.  Patient able to ambulate in department prior to ED.  Evaluation does not show acute pathology that would require ongoing or additional emergent interventions while in the emergency department or further inpatient treatment.  I have discussed the diagnosis with the patient and answered all questions.  Pain is been managed while in the emergency department and patient has no further complaints prior to discharge.  Patient is comfortable with plan discussed in room and is stable for discharge at this time.  I have discussed strict return precautions for returning to the emergency department.  Patient was encouraged to follow-up with PCP/specialist refer to at discharge.    MDM Rules/Calculators/A&P                           Final Clinical Impression(s) / ED  Diagnoses Final diagnoses:  Trapezius muscle spasm  Neck pain    Rx / DC Orders ED Discharge Orders    None       Lyndie Vanderloop,  Dayona Shaheen A, PA-C 11/29/19 0359    Ezequiel Essex, MD 11/29/19 [phone]

## 2019-11-29 NOTE — Discharge Instructions (Signed)
If you develop fever, numbness or tingling, weakness please seek reevaluation the ED.

## 2019-11-30 ENCOUNTER — Ambulatory Visit (INDEPENDENT_AMBULATORY_CARE_PROVIDER_SITE_OTHER): Payer: BC Managed Care – PPO | Admitting: Nurse Practitioner

## 2019-11-30 ENCOUNTER — Encounter: Payer: Self-pay | Admitting: Nurse Practitioner

## 2019-11-30 ENCOUNTER — Other Ambulatory Visit: Payer: Self-pay

## 2019-11-30 VITALS — BP 122/82 | HR 78 | Temp 98.0°F | Ht 75.0 in | Wt 226.8 lb

## 2019-11-30 DIAGNOSIS — M542 Cervicalgia: Secondary | ICD-10-CM

## 2019-11-30 DIAGNOSIS — D492 Neoplasm of unspecified behavior of bone, soft tissue, and skin: Secondary | ICD-10-CM | POA: Diagnosis not present

## 2019-11-30 NOTE — Progress Notes (Signed)
Subjective:  Patient ID: Ronald Jackson, male    DOB: [birth year]  Age: 39 y.o. MRN: [id number]  CC: Hospitalization Follow-up (right shoulder pain still hurting bad-got a shot at the hospital that helped but not much-pain intense for 5 days-affecting sleep and work cause he can't lift arm//pt reports hes not taking most all meds he hasn't picked them up at the pharmacy yet but will today)  HPI Mr. Kottke was seen by ED provider yesterday. Diagnosed with trapezius muscle spasm. Oral prednisone, muscle relaxant and hydrocodone prescribed. He has not picked up medications prescribed. He wants to know if he needs to be also evaluated by sports medicine.  Reviewed past Medical, Social and Family history today.  Outpatient Medications Prior to Visit  Medication Sig Dispense Refill  . buPROPion (WELLBUTRIN SR) 150 MG 12 hr tablet Take 1tab daily x7days, then 1tab in Am and PM continuous x 12weeks. (Patient not taking: Reported on 11/30/2019) 180 tablet 1  . cetirizine (ZYRTEC) 10 MG tablet Take 1 tablet (10 mg total) by mouth at bedtime. [phone] (Patient not taking: Reported on 11/30/2019)    . cyclobenzaprine (FLEXERIL) 10 MG tablet Take 1 tablet (10 mg total) by mouth 2 (two) times daily as needed for muscle spasms. (Patient not taking: Reported on 11/30/2019) 20 tablet 0  . fluticasone (FLONASE) 50 MCG/ACT nasal spray Place 2 sprays into both nostrils daily. (Patient not taking: Reported on 11/30/2019) 16 g 0  . guaiFENesin (MUCINEX) 600 MG 12 hr tablet Take 1 tablet (600 mg total) by mouth 2 (two) times daily as needed for cough or to loosen phlegm. (Patient not taking: Reported on 11/30/2019) 14 tablet 0  . HYDROcodone-acetaminophen (NORCO/VICODIN) 5-325 MG tablet Take 1 tablet by mouth every 4 (four) hours as needed. (Patient not taking: Reported on 11/30/2019) 10 tablet 0  . lidocaine (LIDODERM) 5 % Place 1 patch onto the skin daily. Remove & Discard patch within 12 hours or as directed by MD (Patient  not taking: Reported on 11/30/2019) 30 patch 0  . predniSONE (DELTASONE) 50 MG tablet Take 1 tablet (50 mg total) by mouth daily. (Patient not taking: Reported on 11/30/2019) 5 tablet 0  . terbinafine (LAMISIL) 250 MG tablet Take 1 tablet (250 mg total) by mouth daily. (Patient not taking: Reported on 11/30/2019) 90 tablet 0   No facility-administered medications prior to visit.    ROS See HPI  Objective:  BP 122/82   Pulse 78   Temp 98 F (36.7 C) (Tympanic)   Ht 6\' 3"  (1.905 m)   Wt 226 lb 12.8 oz (102.9 kg)   SpO2 98%   BMI 28.35 kg/m   BP Readings from Last 3 Encounters:  11/30/19 122/82  11/29/19 (!) 140/107  10/20/19 128/78    Wt Readings from Last 3 Encounters:  11/30/19 226 lb 12.8 oz (102.9 kg)  10/20/19 221 lb 12.8 oz (100.6 kg)  08/10/18 218 lb (98.9 kg)    Physical Exam Constitutional:      General: He is not in acute distress.    Appearance: He is not diaphoretic.  Cardiovascular:     Rate and Rhythm: Normal rate.     Pulses: Normal pulses.  Pulmonary:     Effort: Pulmonary effort is normal.  Musculoskeletal:     Cervical back: Normal range of motion.  Neurological:     Mental Status: He is alert.    Lab Results  Component Value Date   WBC 7.5 10/20/2019   HGB 14.2  10/20/2019   HCT 42.1 10/20/2019   PLT 166.0 10/20/2019   GLUCOSE 93 10/20/2019   CHOL 174 10/20/2019   TRIG 35.0 10/20/2019   HDL 41.30 10/20/2019   LDLCALC 126 (H) 10/20/2019   ALT 13 10/20/2019   AST 21 10/20/2019   NA 137 10/20/2019   K 4.1 10/20/2019   CL 104 10/20/2019   CREATININE 1.18 10/20/2019   BUN 16 10/20/2019   CO2 29 10/20/2019   TSH 0.35 10/20/2019    Assessment & Plan:  This visit occurred during the SARS-CoV-2 public health emergency.  Safety protocols were in place, including screening questions prior to the visit, additional usage of staff PPE, and extensive cleaning of exam room while observing appropriate contact time as indicated for disinfecting  solutions.   Cadan was seen today for hospitalization follow-up.  Diagnoses and all orders for this visit:  Neck pain  Atypical squamoproliferative skin lesion -     Ambulatory referral to Dermatology   I am having Marliss Czar maintain his guaiFENesin, cetirizine, terbinafine, buPROPion, fluticasone, predniSONE, cyclobenzaprine, lidocaine, and HYDROcodone-acetaminophen.  No orders of the defined types were placed in this encounter.   Problem List Items Addressed This Visit      Musculoskeletal and Integument   Skin lesion of lower extremity    Other Visit Diagnoses    Neck pain    -  Primary       Follow-up: No follow-ups on file.  [name], NP

## 2019-11-30 NOTE — Patient Instructions (Addendum)
Take medications as prescribed by ED provider apply cold compress 2-3x/day, 96mins at a time Call office if no improvement in 1week.  Cervical Sprain  A cervical sprain is a stretch or tear in the tissues that connect bones (ligaments) in the neck. Most neck (cervical) sprains get better in 4-6 weeks. Follow these instructions at home: If you have a neck collar:  Wear it as told by your doctor. Do not take off (do not remove) the collar unless your doctor says that this is safe.  Ask your doctor before adjusting your collar.  If you have long hair, keep it outside of the collar.  Ask your doctor if you may take off the collar for cleaning and bathing. If you may take off the collar: ? Follow instructions from your doctor about how to take off the collar safely. ? Clean the collar by wiping it with mild soap and water. Let it air-dry all the way. ? If your collar has removable pads:  Take the pads out every 1-2 days.  Hand wash the pads with soap and water.  Let the pads air-dry all the way before you put them back in the collar. Do not dry them in a clothes dryer. Do not dry them with a hair dryer. ? Check your skin under the collar for irritation or sores. If you see any, tell your doctor. Managing pain, stiffness, and swelling   Use a cervical traction device, if told by your doctor.  If told, put heat on the affected area. Do this before exercises (physical therapy) or as often as told by your doctor. Use the heat source that your doctor recommends, such as a moist heat pack or a heating pad. ? Place a towel between your skin and the heat source. ? Leave the heat on for 20-30 minutes. ? Take the heat off (remove the heat) if your skin turns bright red. This is very important if you cannot feel pain, heat, or cold. You may have a greater risk of getting burned.  Put ice on the affected area. ? Put ice in a plastic bag. ? Place a towel between your skin and the bag. ? Leave  the ice on for 20 minutes, 2-3 times a day. Activity  Do not drive while wearing a neck collar. If you do not have a neck collar, ask your doctor if it is safe to drive.  Do not drive or use heavy machinery while taking prescription pain medicine or muscle relaxants, unless your doctor approves.  Do not lift anything that is heavier than 10 lb (4.5 kg) until your doctor tells you that it is safe.  Rest as told by your doctor.  Avoid activities that make you feel worse. Ask your doctor what activities are safe for you.  Do exercises as told by your doctor or physical therapist. Preventing neck sprain  Practice good posture. Adjust your workstation to help with this, if needed.  Exercise regularly as told by your doctor or physical therapist.  Avoid activities that are risky or may cause a neck sprain (cervical sprain). General instructions  Take over-the-counter and prescription medicines only as told by your doctor.  Do not use any products that contain nicotine or tobacco. This includes cigarettes and e-cigarettes. If you need help quitting, ask your doctor.  Keep all follow-up visits as told by your doctor. This is important. Contact a doctor if:  You have pain or other symptoms that get worse.  You have symptoms  that do not get better after 2 weeks.  You have pain that does not get better with medicine.  You start to have new, unexplained symptoms.  You have sores or irritated skin from wearing your neck collar. Get help right away if:  You have very bad pain.  You have any of the following in any part of your body: ? Loss of feeling (numbness). ? Tingling. ? Weakness.  You cannot move a part of your body (you have paralysis).  Your activity level does not improve. Summary  A cervical sprain is a stretch or tear in the tissues that connect bones (ligaments) in the neck.  If you have a neck (cervical) collar, do not take off the collar unless your doctor says  that this is safe.  Put ice on affected areas as told by your doctor.  Put heat on affected areas as told by your doctor.  Good posture and regular exercise can help prevent a neck sprain from happening again. This information is not intended to replace advice given to you by your health care provider. Make sure you discuss any questions you have with your health care provider. Document Revised: 09/22/2018 Document Reviewed: 02/12/2016 Elsevier Patient Education  [employer].

## 2019-12-13 MED ORDER — CYCLOBENZAPRINE HCL 10 MG PO TABS: 10.0000 mg | ORAL_TABLET | Freq: Every day | ORAL | 0 refills | Status: DC

## 2019-12-15 DIAGNOSIS — M542 Cervicalgia: Secondary | ICD-10-CM | POA: Diagnosis not present

## 2019-12-15 DIAGNOSIS — M25511 Pain in right shoulder: Secondary | ICD-10-CM | POA: Diagnosis not present

## 2019-12-21 ENCOUNTER — Encounter: Payer: Self-pay | Admitting: Nurse Practitioner

## 2019-12-21 ENCOUNTER — Other Ambulatory Visit: Payer: Self-pay | Admitting: Nurse Practitioner

## 2019-12-21 DIAGNOSIS — S46811S Strain of other muscles, fascia and tendons at shoulder and upper arm level, right arm, sequela: Secondary | ICD-10-CM

## 2019-12-21 DIAGNOSIS — M25511 Pain in right shoulder: Secondary | ICD-10-CM

## 2019-12-22 MED ORDER — CYCLOBENZAPRINE HCL 10 MG PO TABS: 10.0000 mg | ORAL_TABLET | Freq: Every day | ORAL | 0 refills | Status: DC

## 2020-01-07 DIAGNOSIS — M25511 Pain in right shoulder: Secondary | ICD-10-CM | POA: Diagnosis not present

## 2020-01-17 DIAGNOSIS — M75101 Unspecified rotator cuff tear or rupture of right shoulder, not specified as traumatic: Secondary | ICD-10-CM | POA: Diagnosis not present

## 2020-01-19 IMAGING — CT CT ABD-PELV W/ CM
2 of 4 series · 17 of 46 positions shown, 19 images · IV contrast (APPLIED)
Comparison: 12/20/2013

CLINICAL DATA: Left lower quadrant pain for 2 weeks

EXAM:
CT ABDOMEN AND PELVIS WITH CONTRAST
TECHNIQUE: Multidetector CT imaging of the abdomen and pelvis was performed
using the standard protocol following bolus administration of
intravenous contrast.
CONTRAST:  100mL CYYBVA-BJJ IOPAMIDOL (CYYBVA-BJJ) INJECTION 61%

[Series 2: axial st · axial · 0.86mm/px · z∈[-560,-90]mm · 14 of 104 slices shown, 16 images]
[im 5/104  soft-tissue]
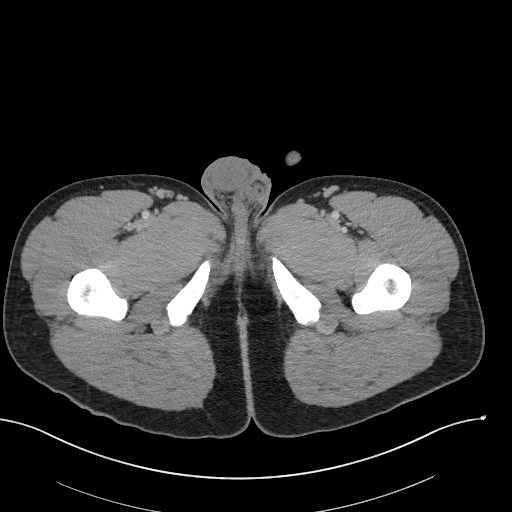
[im 5/104  bone]
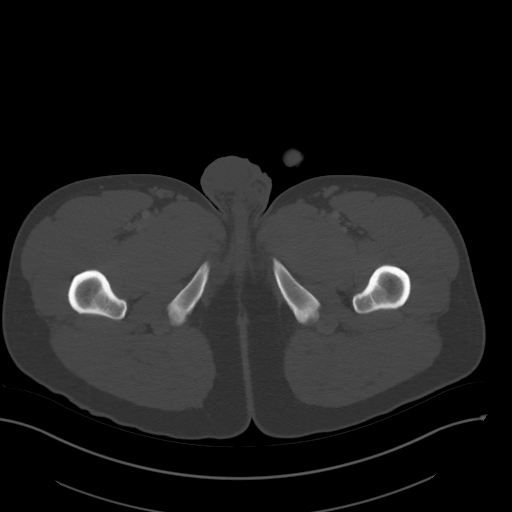
[im 13/104  soft-tissue]
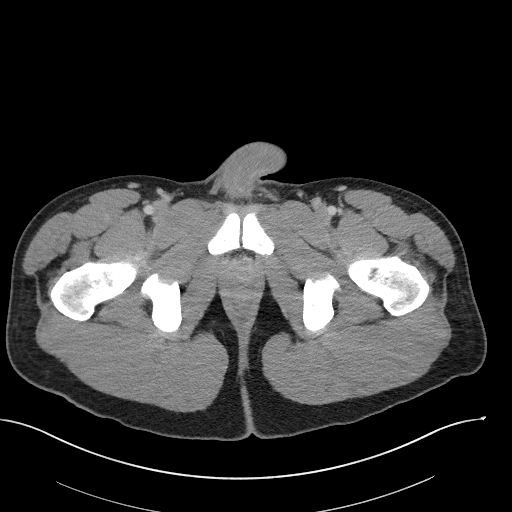
[im 22/104  soft-tissue]
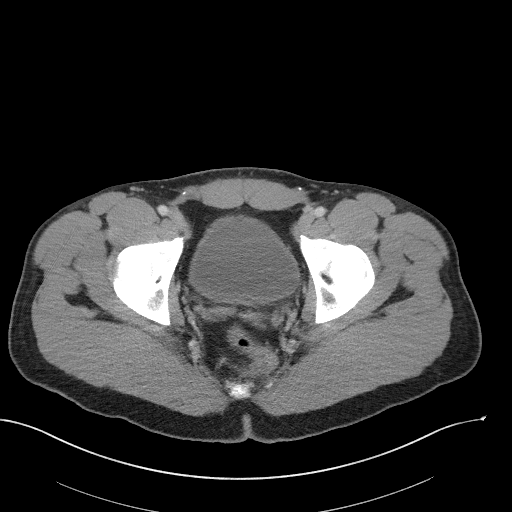
[im 26/104  soft-tissue]
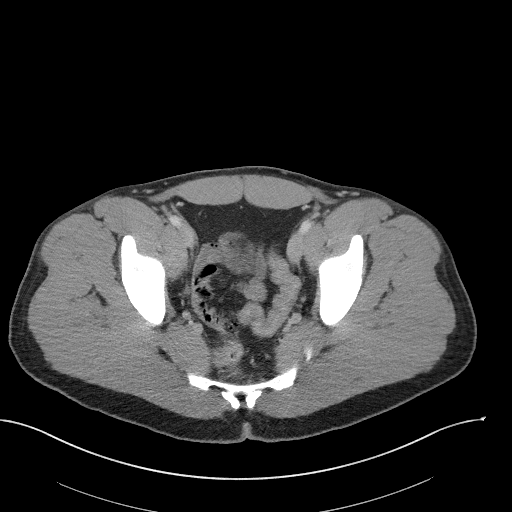
[im 35/104  soft-tissue]
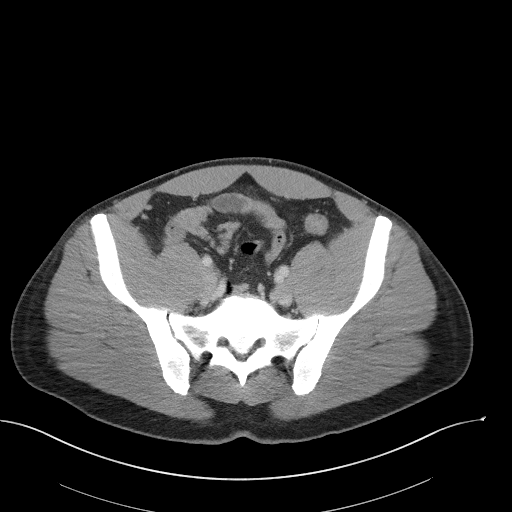
[im 43/104  soft-tissue]
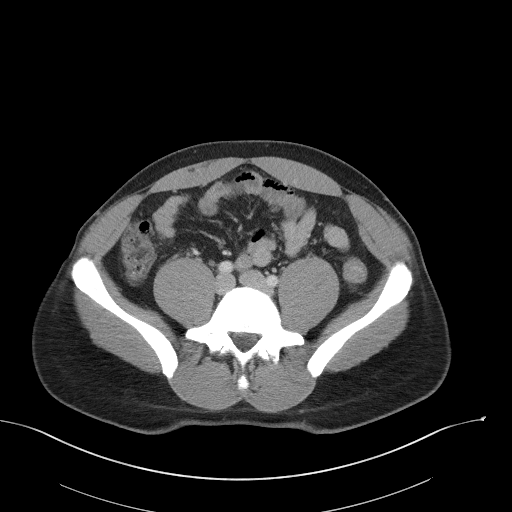
[im 48/104  soft-tissue]
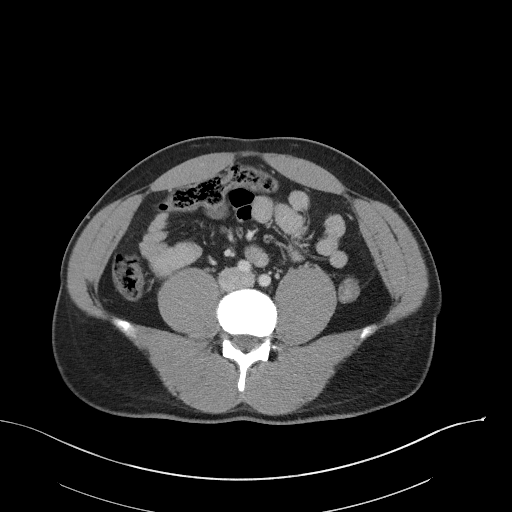
[im 56/104  soft-tissue]
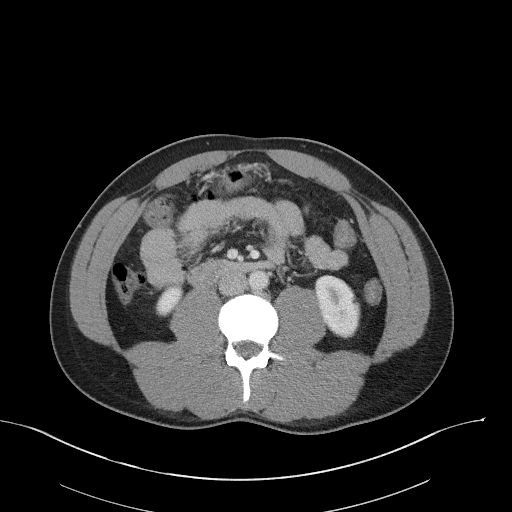
[im 61/104  soft-tissue]
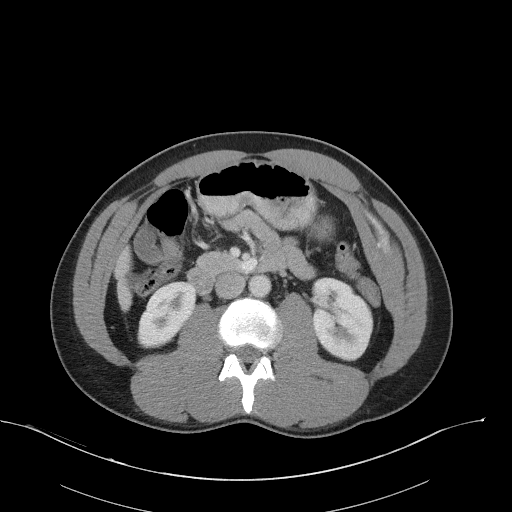
[im 61/104  bone]
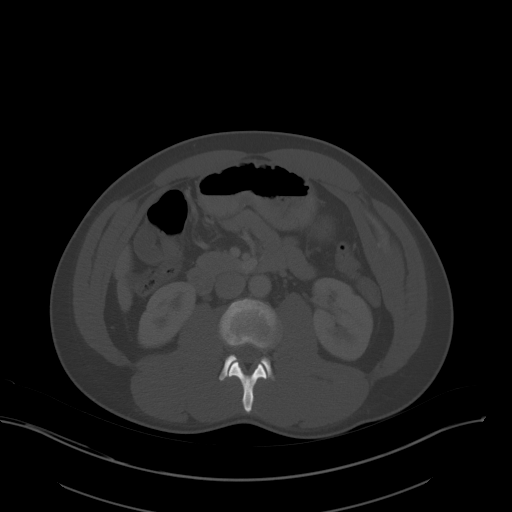
[im 69/104  soft-tissue]
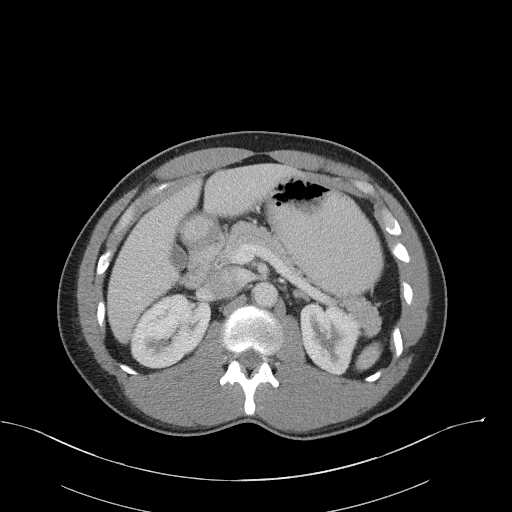
[im 78/104  soft-tissue]
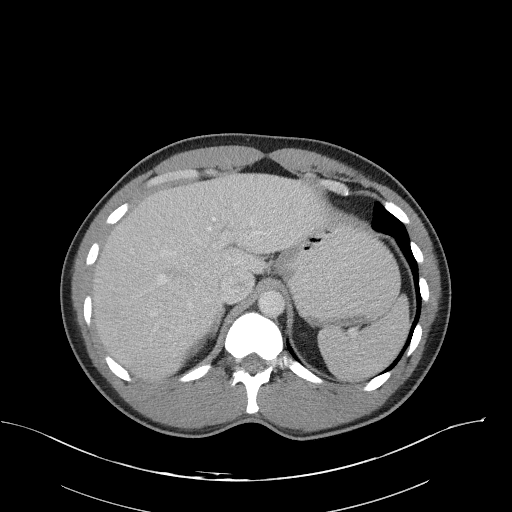
[im 82/104  soft-tissue]
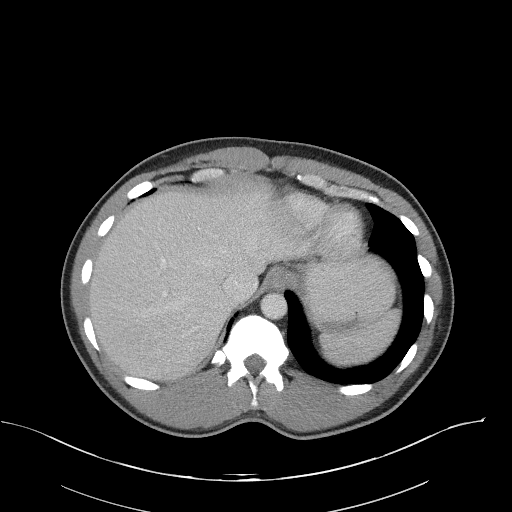
[im 91/104  soft-tissue]
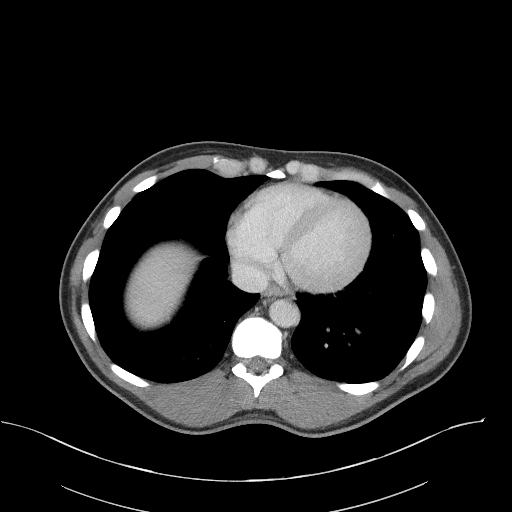
[im 99/104  soft-tissue]
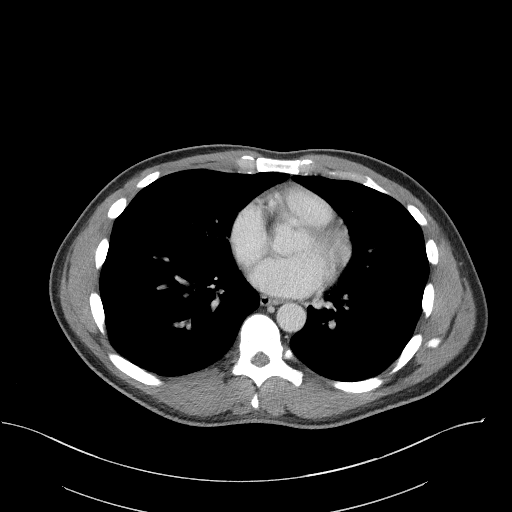

[Series 5: coronal st · coronal · 0.85mm/px · 3 of 83 slices shown]
[im 28/83  soft-tissue]
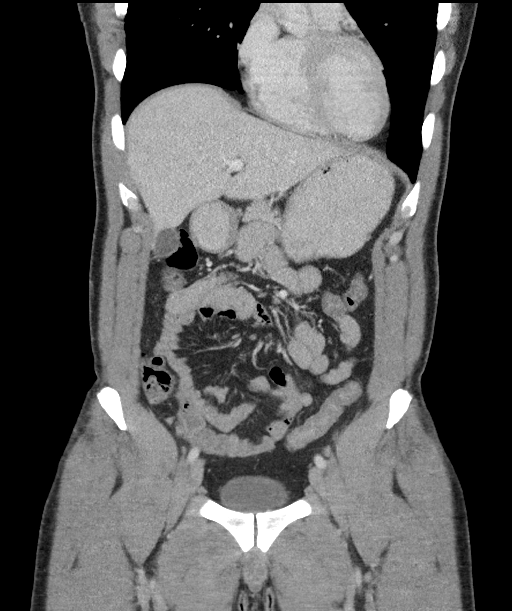
[im 37/83  soft-tissue]
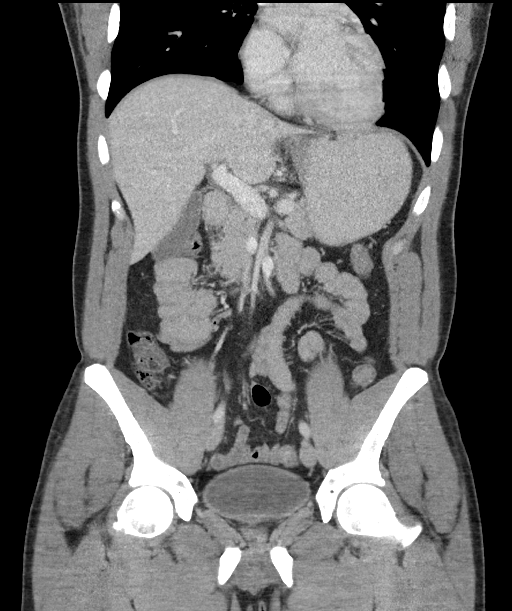
[im 46/83  soft-tissue]
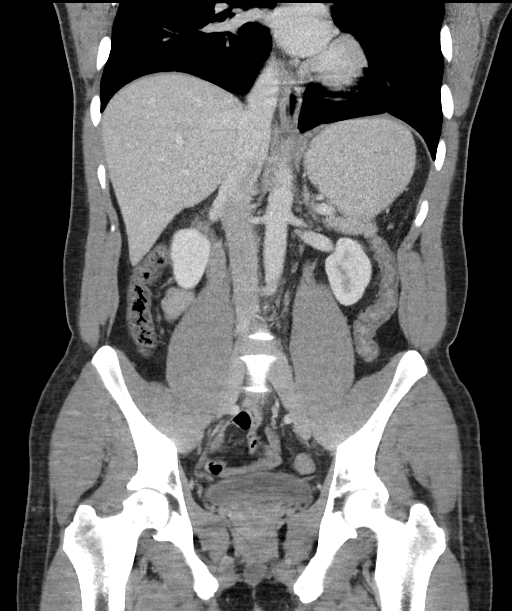

[17 of 46 positions shown; findings below may reference images not displayed]

FINDINGS: Lower chest:  No contributory findings.

Hepatobiliary: No focal liver abnormality.No evidence of biliary
obstruction or stone.

Pancreas: Unremarkable.

Spleen: Unremarkable.

Adrenals/Urinary Tract: Negative adrenals. No hydronephrosis or
stone. Unremarkable bladder.

Stomach/Bowel:  No obstruction. No appendicitis.

Vascular/Lymphatic: No acute vascular abnormality. Early
atheromatous calcification of the aorta. No mass or adenopathy.

Reproductive:No pathologic findings.

Other: No ascites or pneumoperitoneum.

Musculoskeletal: Negative.
IMPRESSION: No acute finding or explanation for symptoms.

## 2020-01-27 ENCOUNTER — Encounter: Payer: Self-pay | Admitting: Nurse Practitioner

## 2020-01-27 DIAGNOSIS — Z20822 Contact with and (suspected) exposure to covid-19: Secondary | ICD-10-CM | POA: Diagnosis not present

## 2020-03-23 ENCOUNTER — Encounter: Payer: Self-pay | Admitting: Nurse Practitioner

## 2020-03-26 ENCOUNTER — Ambulatory Visit: Payer: BC Managed Care – PPO | Admitting: Dermatology

## 2020-08-28 ENCOUNTER — Telehealth (INDEPENDENT_AMBULATORY_CARE_PROVIDER_SITE_OTHER): Payer: BC Managed Care – PPO | Admitting: Nurse Practitioner

## 2020-08-28 ENCOUNTER — Encounter: Payer: Self-pay | Admitting: Nurse Practitioner

## 2020-08-28 VITALS — Ht 75.0 in | Wt 230.0 lb

## 2020-08-28 DIAGNOSIS — J209 Acute bronchitis, unspecified: Secondary | ICD-10-CM | POA: Diagnosis not present

## 2020-08-28 DIAGNOSIS — F17209 Nicotine dependence, unspecified, with unspecified nicotine-induced disorders: Secondary | ICD-10-CM | POA: Diagnosis not present

## 2020-08-28 MED ORDER — DOXYCYCLINE HYCLATE 100 MG PO TABS: 100.0000 mg | ORAL_TABLET | Freq: Two times a day (BID) | ORAL | 0 refills | Status: AC

## 2020-08-28 MED ORDER — ALBUTEROL SULFATE HFA 108 (90 BASE) MCG/ACT IN AERS: 1.0000 | INHALATION_SPRAY | Freq: Four times a day (QID) | RESPIRATORY_TRACT | 0 refills | Status: DC | PRN

## 2020-08-28 NOTE — Progress Notes (Signed)
Virtual Visit via Video Note  I connected with@ on 08/28/20 at 11:00 AM EDT by a video enabled telemedicine application and verified that I am speaking with the correct person using two identifiers.  Location: Patient:Home Provider: Office Participants: patient and provider  I discussed the limitations of evaluation and management by telemedicine and the availability of in person appointments. I also discussed with the patient that there may be a patient responsible charge related to this service. The patient expressed understanding and agreed to proceed.  CC:Pt c/o fatigue, cough, congestion, SOB, and poor appetite x 2.5 weeks. Pt has took alka-seltzer cold and flu.  History of Present Illness: Cough This is a new problem. The current episode started 1 to 4 weeks ago. The problem has been unchanged. The problem occurs constantly. The cough is non-productive. Associated symptoms include myalgias, nasal congestion, postnasal drip and shortness of breath. Pertinent negatives include no chest pain, chills, fever, headaches, heartburn, hemoptysis, rhinorrhea, sore throat, sweats, weight loss or wheezing. The symptoms are aggravated by exercise. Risk factors for lung disease include smoking/tobacco exposure. He has tried OTC cough suppressant and rest for the symptoms. The treatment provided mild relief.   Observations/Objective: Physical Exam Constitutional:      General: He is not in acute distress. Pulmonary:     Effort: Pulmonary effort is normal.  Neurological:     Mental Status: He is alert and oriented to person, place, and time.    Assessment and Plan: Layla was seen today for acute visit.  Diagnoses and all orders for this visit:  Acute bronchitis, unspecified organism -     doxycycline (VIBRA-TABS) 100 MG tablet; Take 1 tablet (100 mg total) by mouth 2 (two) times daily for 7 days. -     albuterol (VENTOLIN HFA) 108 (90 Base) MCG/ACT inhaler; Inhale 1-2 puffs into the lungs  every 6 (six) hours as needed for wheezing or shortness of breath.  Tobacco use disorder, continuous -     doxycycline (VIBRA-TABS) 100 MG tablet; Take 1 tablet (100 mg total) by mouth 2 (two) times daily for 7 days. -     albuterol (VENTOLIN HFA) 108 (90 Base) MCG/ACT inhaler; Inhale 1-2 puffs into the lungs every 6 (six) hours as needed for wheezing or shortness of breath.   Follow Up Instructions: Maintain adequate oral hydration Take medication with food. Quit tobacco use Call office if no improvement in 3days. Go to ED if symptoms worsen.   I discussed the assessment and treatment plan with the patient. The patient was provided an opportunity to ask questions and all were answered. The patient agreed with the plan and demonstrated an understanding of the instructions.   The patient was advised to call back or seek an in-person evaluation if the symptoms worsen or if the condition fails to improve as anticipated.   [name], NP

## 2020-08-28 NOTE — Patient Instructions (Signed)
Maintain adequate oral hydration Take medication with food. Quit tobacco use Call office if no improvement in 3days. Go to ED if symptoms worsen.

## 2020-08-30 ENCOUNTER — Encounter (HOSPITAL_COMMUNITY): Payer: Self-pay | Admitting: Emergency Medicine

## 2020-08-30 ENCOUNTER — Emergency Department (HOSPITAL_COMMUNITY): Payer: BC Managed Care – PPO

## 2020-08-30 ENCOUNTER — Emergency Department (HOSPITAL_COMMUNITY)
Admission: EM | Admit: 2020-08-30 | Discharge: 2020-08-30 | Disposition: A | Discharge status: Home / Self Care | Payer: BC Managed Care – PPO | Attending: Emergency Medicine | Admitting: Emergency Medicine

## 2020-08-30 DIAGNOSIS — S20302A Unspecified superficial injuries of left front wall of thorax, initial encounter: Secondary | ICD-10-CM | POA: Diagnosis not present

## 2020-08-30 DIAGNOSIS — F17210 Nicotine dependence, cigarettes, uncomplicated: Secondary | ICD-10-CM | POA: Diagnosis not present

## 2020-08-30 DIAGNOSIS — Y92481 Parking lot as the place of occurrence of the external cause: Secondary | ICD-10-CM | POA: Diagnosis not present

## 2020-08-30 DIAGNOSIS — S298XXA Other specified injuries of thorax, initial encounter: Secondary | ICD-10-CM

## 2020-08-30 DIAGNOSIS — S299XXA Unspecified injury of thorax, initial encounter: Secondary | ICD-10-CM | POA: Diagnosis present

## 2020-08-30 MED ORDER — METHOCARBAMOL 500 MG PO TABS
500.0000 mg | ORAL_TABLET | Freq: Three times a day (TID) | ORAL | 0 refills | Status: DC | PRN
Start: 2020-08-30 — End: 2020-09-06
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
## 2020-08-30 NOTE — ED Notes (Signed)
Patient transported to X-ray 
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
## 2020-08-31 ENCOUNTER — Encounter: Payer: Self-pay | Admitting: Nurse Practitioner

## 2020-09-03 ENCOUNTER — Encounter: Payer: Self-pay | Admitting: Nurse Practitioner

## 2020-09-06 ENCOUNTER — Other Ambulatory Visit: Payer: Self-pay

## 2020-09-06 ENCOUNTER — Encounter: Payer: Self-pay | Admitting: Nurse Practitioner

## 2020-09-06 ENCOUNTER — Ambulatory Visit (INDEPENDENT_AMBULATORY_CARE_PROVIDER_SITE_OTHER): Payer: BC Managed Care – PPO | Admitting: Nurse Practitioner

## 2020-09-06 VITALS — BP 140/82 | HR 84 | Temp 98.0°F | Ht 75.0 in | Wt 234.0 lb

## 2020-09-06 DIAGNOSIS — S29011A Strain of muscle and tendon of front wall of thorax, initial encounter: Secondary | ICD-10-CM | POA: Diagnosis not present

## 2020-09-06 DIAGNOSIS — S161XXA Strain of muscle, fascia and tendon at neck level, initial encounter: Secondary | ICD-10-CM | POA: Diagnosis not present

## 2020-09-06 MED ORDER — IBUPROFEN 600 MG PO TABS: 600.0000 mg | ORAL_TABLET | Freq: Three times a day (TID) | ORAL | 0 refills | Status: DC

## 2020-09-06 MED ORDER — METHOCARBAMOL 500 MG PO TABS: 500.0000 mg | ORAL_TABLET | Freq: Three times a day (TID) | ORAL | 0 refills | Status: DC | PRN

## 2020-09-06 NOTE — Patient Instructions (Signed)
Your FMLA form will be completed for 08/30/2020 to 09/13/2020. Start ibuprofen and robaxin as discussed F/up in 2weeks.  Chest Wall Pain Chest wall pain is pain in or around the bones and muscles of your chest. Sometimes, an injury causes this pain. Excessive coughing or overuse of arm and chest muscles may also cause chest wall pain. Sometimes, the cause may not be known. This pain may take several weeks or longer to get better. Follow these instructions at home: Managing pain, stiffness, and swelling  If directed, put ice on the painful area: ? Put ice in a plastic bag. ? Place a towel between your skin and the bag. ? Leave the ice on for 20 minutes, 2-3 times per day.   Activity  Rest as told by your health care provider.  Avoid activities that cause pain. These include any activities that use your chest muscles or your abdominal and side muscles to lift heavy items. Ask your health care provider what activities are safe for you. General instructions  Take over-the-counter and prescription medicines only as told by your health care provider.  Do not use any products that contain nicotine or tobacco, such as cigarettes, e-cigarettes, and chewing tobacco. These can delay healing after injury. If you need help quitting, ask your health care provider.  Keep all follow-up visits as told by your health care provider. This is important.   Contact a health care provider if:  You have a fever.  Your chest pain becomes worse.  You have new symptoms. Get help right away if:  You have nausea or vomiting.  You feel sweaty or light-headed.  You have a cough with mucus from your lungs (sputum) or you cough up blood.  You develop shortness of breath. These symptoms may represent a serious problem that is an emergency. Do not wait to see if the symptoms will go away. Get medical help right away. Call your local emergency services (911 in the [place] Do not drive yourself to the  hospital. Summary  Chest wall pain is pain in or around the bones and muscles of your chest.  Depending on the cause, it may be treated with ice, rest, medicines, and avoiding activities that cause pain.  Contact a health care provider if you have a fever, worsening chest pain, or new symptoms.  Get help right away if you feel light-headed or you develop shortness of breath. These symptoms may be an emergency. This information is not intended to replace advice given to you by your health care provider. Make sure you discuss any questions you have with your health care provider. Document Revised: 12/03/2017 Document Reviewed: 12/03/2017 Elsevier Patient Education  2021 [employer].
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
## 2020-09-12 ENCOUNTER — Encounter: Payer: Self-pay | Admitting: Nurse Practitioner

## 2020-09-18 ENCOUNTER — Telehealth: Payer: Self-pay

## 2020-09-18 ENCOUNTER — Encounter: Payer: Self-pay | Admitting: Nurse Practitioner

## 2020-09-18 NOTE — Telephone Encounter (Signed)
Letter written. We do not send medical informal through personal email. He can come to the office for a copy or it can be mailed or he get printed a copy through his mychart.

## 2020-09-18 NOTE — Telephone Encounter (Signed)
Pt states he was released to return to work by [employer]. He worked yesterday and is working today. His employer needs something stating he is ok to return. His company [employer] is being audited today and he needs something as soon as possible. He would like it sent to his email [email or web address] His managers are in a meeting and he cannot find a fax number for the office.

## 2020-09-19 NOTE — Telephone Encounter (Signed)
Patient notified and verbalized understanding. 

## 2020-09-21 ENCOUNTER — Emergency Department (HOSPITAL_BASED_OUTPATIENT_CLINIC_OR_DEPARTMENT_OTHER)
Admission: EM | Admit: 2020-09-21 | Discharge: 2020-09-21 | Disposition: A | Discharge status: Home / Self Care | Payer: BC Managed Care – PPO | Attending: Emergency Medicine | Admitting: Emergency Medicine

## 2020-09-21 ENCOUNTER — Encounter (HOSPITAL_BASED_OUTPATIENT_CLINIC_OR_DEPARTMENT_OTHER): Payer: Self-pay

## 2020-09-21 ENCOUNTER — Other Ambulatory Visit: Payer: Self-pay

## 2020-09-21 DIAGNOSIS — G8929 Other chronic pain: Secondary | ICD-10-CM | POA: Insufficient documentation

## 2020-09-21 DIAGNOSIS — M25511 Pain in right shoulder: Secondary | ICD-10-CM | POA: Diagnosis present

## 2020-09-21 DIAGNOSIS — F17210 Nicotine dependence, cigarettes, uncomplicated: Secondary | ICD-10-CM | POA: Diagnosis not present

## 2020-09-21 MED ORDER — LIDOCAINE 5 % EX PTCH
1.0000 | MEDICATED_PATCH | Freq: Once | CUTANEOUS | Status: DC
  Administered 2020-09-21: 1 via TRANSDERMAL
  Filled 2020-09-21: qty 1
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
## 2020-10-03 ENCOUNTER — Other Ambulatory Visit: Payer: Self-pay

## 2020-10-04 ENCOUNTER — Encounter: Payer: Self-pay | Admitting: Nurse Practitioner

## 2020-10-04 ENCOUNTER — Ambulatory Visit (INDEPENDENT_AMBULATORY_CARE_PROVIDER_SITE_OTHER): Payer: BC Managed Care – PPO | Admitting: Nurse Practitioner

## 2020-10-04 VITALS — BP 112/74 | HR 64 | Temp 98.5°F | Ht 75.0 in | Wt 229.6 lb

## 2020-10-04 DIAGNOSIS — F4310 Post-traumatic stress disorder, unspecified: Secondary | ICD-10-CM

## 2020-10-04 DIAGNOSIS — M25512 Pain in left shoulder: Secondary | ICD-10-CM

## 2020-10-04 DIAGNOSIS — M25511 Pain in right shoulder: Secondary | ICD-10-CM | POA: Diagnosis not present

## 2020-10-04 DIAGNOSIS — F4322 Adjustment disorder with anxiety: Secondary | ICD-10-CM

## 2020-10-04 MED ORDER — BUSPIRONE HCL 7.5 MG PO TABS: 7.5000 mg | ORAL_TABLET | Freq: Two times a day (BID) | ORAL | 5 refills | Status: DC
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
## 2020-10-04 NOTE — Patient Instructions (Addendum)
Maintain appt with ortho  Managing Post-Traumatic Stress Disorder If you have been diagnosed with post-traumatic stress disorder (PTSD), you may be relieved that you now know why you have felt or behaved a certain way. Still, you may feel overwhelmed about the treatment ahead. You may also wonder how to get the support you need and how to deal with the condition day-to-day. If you are living with PTSD, there are ways to help you recover from it and manage your symptoms. How to manage lifestyle changes Managing stress Stress is your body's reaction to life changes and events, both good and bad. Stress can make PTSD worse. Take the following steps to manage stress:  Talk with your health care provider or a counselor if you would like to learn more about techniques to reduce your stress. He or she may suggest some stress reduction techniques such as: ? Muscle relaxation exercises. ? Regular exercise. ? Meditation, yoga, or other mind-body exercises. ? Breathing exercises. ? Listening to quiet music. ? Spending time outside.  Maintain a healthy lifestyle. Eat a healthy diet, exercise regularly, get plenty of sleep, and take time to relax.  Spend time with others. Talk with them about how you are feeling and what kind of support you need. Try not to isolate yourself, even though you may feel like doing that. Isolating yourself can delay your recovery.  Do activities and hobbies that you enjoy.  Pace yourself when doing stressful things. Take breaks, and reward yourself when you finish. Make sure that you do not overload your schedule.   Medicines Your health care provider may suggest certain medicines if he or she feels that they will help to improve your condition. Medicines for depression (antidepressants) or severe loss of contact with reality (antipsychotics) may be used to treat PTSD. Avoid using alcohol and other substances that may prevent your medicines from working properly. It is also  important to:  Talk with your pharmacist or health care provider about all medicines that you take, their possible side effects, and which medicines are safe to take together.  Make it your goal to take part in all treatment decisions (shared decision-making). Ask about possible side effects of medicines that your health care provider recommends, and tell him or her how you feel about having those side effects. It is best if shared decision-making with your health care provider is part of your total treatment plan. If your health care provider prescribes a medicine, you may not notice the full benefits of it for 4-8 weeks. Most people who are treated for PTSD need to take medicine for at least 6-12 months before they feel better. If you are taking medicines as part of your treatment, do not stop taking medicines before you ask your health care provider if it is safe to stop. You may need to have the medicine slowly decreased (tapered) over time to lower the risk of harmful side effects. Relationships Many people who have PTSD have difficulty trusting others. Make an effort to:  Take risks and develop trust with close friends and family members. Developing trust in others can help you feel safe and connect you with emotional support.  Be open and honest about your feelings.  Have fun and relax in safe spaces, such as with friends and family.  Think about going to couples counseling, family education classes, or family therapy. Your loved ones may not always know how to be supportive. Therapy can be helpful for everyone. How to recognize changes in  your condition Be aware of your symptoms and how often you have them. The following symptoms mean that you need to seek help for your PTSD:  You feel suspicious and angry.  You have repeated flashbacks.  You avoid going out or being with others.  You have an increasing number of fights with close friends or family members, such as your  spouse.  You have thoughts about hurting yourself or others.  You cannot get relief from feelings of depression or anxiety. Follow these instructions at home: Lifestyle  Exercise regularly. Try to do 30 or more minutes of physical activity on most days of the week.  Try to get 7-9 hours of sleep each night. To help with sleep: ? Keep your bedroom cool and dark. ? Avoid screen time before bedtime. This means avoiding use of your TV, computer, tablet, and cell phone.  Practice self-soothing skills and use them daily.  Try to have fun and seek humor in your life. Eating and drinking  Do not eat a heavy meal during the hour before you go to bed.  Do not drink alcohol or caffeinated drinks before bed.  Avoid using alcohol or drugs. General instructions  If your PTSD is affecting your marriage or family, seek help from a family therapist.  Remind yourself that recovering from the trauma is a process and takes time.  Take over-the-counter and prescription medicines only as told by your health care provider.  Make sure to let all of your health care providers know that you have PTSD. This is especially important if you are having surgery or need to be admitted to the hospital.  Keep all follow-up visits as told by your health care providers. This is important. Where to find support Talking to others  Explain that PTSD is a mental health problem. It is something that a person can develop after experiencing or seeing a life-threatening event. Tell them that PTSD makes you feel stress like you did during the event.  Talk to your loved ones about the symptoms you have. Also tell them what things or situations can cause symptoms to start (are triggers for you).  Assure your loved ones that there are treatments to help PTSD. Discuss possibly seeking family therapy or couples therapy.  If you are worried or fearful about seeking treatment, ask for support.  Keep daily contact with at  least one trusted friend or family member. Finances Not all insurance plans cover mental health care, so it is important to check with your insurance carrier. If paying for co-pays or counseling services is a problem, search for a local or county mental health care center. Public mental health care services may be offered there at a low cost or no cost when you are not able to see a private health care provider. If you are a veteran, contact a local veterans organization or veterans hospital for more information. If you are taking medicine for PTSD, you may be able to get the genericform, which may be less expensive than brand-name medicine. Some makers of prescription medicines also offer help to patients who cannot afford the medicines that they need. Therapy and support groups  Find a support group in your community. Often, groups are available for [employer] veterans, trauma victims, and family members or caregivers.  Look into volunteer opportunities. Taking part in these can help you feel more connected to your community.  Contact a local organization to find out if you are eligible for a service dog. Where  to find more information Go to this website to find more information about PTSD, treatment of PTSD, and how to get support:  [facility] for PTSD: www.ptsd.[employer].cz Contact a health care provider if:  Your symptoms get worse or do not get better. Get help right away if:  You have thoughts about hurting yourself or others. If you ever feel like you may hurt yourself or others, or have thoughts about taking your own life, get help right away. You can go to your nearest emergency department or call:  Your local emergency services (911 in the [place]  A suicide crisis helpline, such as the [place] at 442-759-4359. This is open 24-hours a day. Summary  If you are living with PTSD, there are ways to help you recover from it and manage your  symptoms.  Find supportive environments and people who understand PTSD. Spend time in those places, and maintain contact with those people.  Work with your health care team to create a plan for managing PTSD. The plan should include counseling, stress reduction techniques, and healthy lifestyle habits. This information is not intended to replace advice given to you by your health care provider. Make sure you discuss any questions you have with your health care provider. Document Revised: 02/17/2020 Document Reviewed: 02/17/2020 Elsevier Patient Education  2021 [employer].

## 2020-10-18 ENCOUNTER — Encounter: Payer: Self-pay | Admitting: Nurse Practitioner

## 2020-10-18 DIAGNOSIS — L989 Disorder of the skin and subcutaneous tissue, unspecified: Secondary | ICD-10-CM

## 2020-10-19 MED ORDER — FLUOCINOLONE ACETONIDE 0.025 % EX OINT: TOPICAL_OINTMENT | Freq: Two times a day (BID) | CUTANEOUS | 0 refills | Status: DC

## 2020-11-02 ENCOUNTER — Other Ambulatory Visit: Payer: Self-pay

## 2020-11-02 ENCOUNTER — Emergency Department (HOSPITAL_BASED_OUTPATIENT_CLINIC_OR_DEPARTMENT_OTHER)
Admission: EM | Admit: 2020-11-02 | Discharge: 2020-11-02 | Disposition: A | Discharge status: Home / Self Care | Payer: BC Managed Care – PPO | Attending: Emergency Medicine | Admitting: Emergency Medicine

## 2020-11-02 ENCOUNTER — Encounter (HOSPITAL_BASED_OUTPATIENT_CLINIC_OR_DEPARTMENT_OTHER): Payer: Self-pay

## 2020-11-02 DIAGNOSIS — F17210 Nicotine dependence, cigarettes, uncomplicated: Secondary | ICD-10-CM | POA: Insufficient documentation

## 2020-11-02 DIAGNOSIS — M542 Cervicalgia: Secondary | ICD-10-CM | POA: Diagnosis not present

## 2020-11-02 DIAGNOSIS — G8929 Other chronic pain: Secondary | ICD-10-CM | POA: Diagnosis not present

## 2020-11-02 DIAGNOSIS — M25511 Pain in right shoulder: Secondary | ICD-10-CM | POA: Diagnosis not present

## 2020-11-02 MED ORDER — ACETAMINOPHEN 325 MG PO TABS
650.0000 mg | ORAL_TABLET | Freq: Once | ORAL | Status: AC
  Administered 2020-11-02: 650 mg via ORAL
  Filled 2020-11-02: qty 2

## 2020-11-02 MED ORDER — METHOCARBAMOL 500 MG PO TABS
500.0000 mg | ORAL_TABLET | Freq: Two times a day (BID) | ORAL | 0 refills | Status: DC
Start: 2020-11-02 — End: 2020-11-29

## 2020-11-02 MED ORDER — LIDOCAINE 5 % EX PTCH
2.0000 | MEDICATED_PATCH | CUTANEOUS | Status: DC
  Administered 2020-11-02: 2 via TRANSDERMAL
  Filled 2020-11-02: qty 2

## 2020-11-02 NOTE — Discharge Instructions (Addendum)
As discussed, please follow-up with your orthopedic surgeon for further evaluation of your pain.  I am sending you home with a muscle relaxer. Take as needed for pain. Muscle relaxer can cause drowsiness so do not drive or operate machinery while on the medication.  You may also purchase over-the-counter Lidoderm patches and Voltaren gel for added pain relief.  Please return to the ER for new or worsening symptoms.
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
## 2020-11-08 ENCOUNTER — Encounter: Payer: Self-pay | Admitting: Nurse Practitioner

## 2020-11-08 ENCOUNTER — Ambulatory Visit (INDEPENDENT_AMBULATORY_CARE_PROVIDER_SITE_OTHER): Payer: BC Managed Care – PPO | Admitting: Nurse Practitioner

## 2020-11-08 ENCOUNTER — Other Ambulatory Visit: Payer: Self-pay

## 2020-11-08 VITALS — BP 114/74 | HR 82 | Temp 97.0°F | Ht 75.0 in | Wt 233.4 lb

## 2020-11-08 DIAGNOSIS — F411 Generalized anxiety disorder: Secondary | ICD-10-CM | POA: Insufficient documentation

## 2020-11-08 DIAGNOSIS — M5020 Other cervical disc displacement, unspecified cervical region: Secondary | ICD-10-CM | POA: Insufficient documentation

## 2020-11-08 DIAGNOSIS — M75101 Unspecified rotator cuff tear or rupture of right shoulder, not specified as traumatic: Secondary | ICD-10-CM | POA: Insufficient documentation

## 2020-11-08 DIAGNOSIS — F4310 Post-traumatic stress disorder, unspecified: Secondary | ICD-10-CM | POA: Diagnosis not present

## 2020-11-08 MED ORDER — FLUOXETINE HCL 10 MG PO TABS: 10.0000 mg | ORAL_TABLET | Freq: Every day | ORAL | 5 refills | Status: DC

## 2020-11-08 NOTE — Patient Instructions (Addendum)
Try better help or headspace. These are app you can download on your phone.   start fluoxetine 1/2 tablet once daily for 1 week and then increase to a full tablet once daily on week two continuously We discussed common side effects such as nausea, drowsiness and weight gain.  Also discussed rare but serious side effect of suicide ideation.  She is instructed to discontinue medication go directly to ED if this occurs.  Pt verbalizes understanding.   Plan follow up in 1 month to evaluate progress.    Managing Post-Traumatic Stress Disorder If you have been diagnosed with post-traumatic stress disorder (PTSD), you may be relieved that you now know why you have felt or behaved a certain way. Still, you may feel overwhelmed about the treatment ahead. You may also wonder how to get the support you need and how to deal with the condition day-to-day. If you are living with PTSD, there are ways to help you recover from it and manage your symptoms. How to manage lifestyle changes Managing stress Stress is your body's reaction to life changes and events, both good and bad. Stress can make PTSD worse. Take the following steps to manage stress:  Talk with your health care provider or a counselor if you would like to learn more about techniques to reduce your stress. He or she may suggest some stress reduction techniques such as: ? Muscle relaxation exercises. ? Regular exercise. ? Meditation, yoga, or other mind-body exercises. ? Breathing exercises. ? Listening to quiet music. ? Spending time outside.  Maintain a healthy lifestyle. Eat a healthy diet, exercise regularly, get plenty of sleep, and take time to relax.  Spend time with others. Talk with them about how you are feeling and what kind of support you need. Try not to isolate yourself, even though you may feel like doing that. Isolating yourself can delay your recovery.  Do activities and hobbies that you enjoy.  Pace yourself when doing  stressful things. Take breaks, and reward yourself when you finish. Make sure that you do not overload your schedule.   Medicines Your health care provider may suggest certain medicines if he or she feels that they will help to improve your condition. Medicines for depression (antidepressants) or severe loss of contact with reality (antipsychotics) may be used to treat PTSD. Avoid using alcohol and other substances that may prevent your medicines from working properly. It is also important to:  Talk with your pharmacist or health care provider about all medicines that you take, their possible side effects, and which medicines are safe to take together.  Make it your goal to take part in all treatment decisions (shared decision-making). Ask about possible side effects of medicines that your health care provider recommends, and tell him or her how you feel about having those side effects. It is best if shared decision-making with your health care provider is part of your total treatment plan. If your health care provider prescribes a medicine, you may not notice the full benefits of it for 4-8 weeks. Most people who are treated for PTSD need to take medicine for at least 6-12 months before they feel better. If you are taking medicines as part of your treatment, do not stop taking medicines before you ask your health care provider if it is safe to stop. You may need to have the medicine slowly decreased (tapered) over time to lower the risk of harmful side effects. Relationships Many people who have PTSD have difficulty trusting others.  Make an effort to:  Take risks and develop trust with close friends and family members. Developing trust in others can help you feel safe and connect you with emotional support.  Be open and honest about your feelings.  Have fun and relax in safe spaces, such as with friends and family.  Think about going to couples counseling, family education classes, or family  therapy. Your loved ones may not always know how to be supportive. Therapy can be helpful for everyone. How to recognize changes in your condition Be aware of your symptoms and how often you have them. The following symptoms mean that you need to seek help for your PTSD:  You feel suspicious and angry.  You have repeated flashbacks.  You avoid going out or being with others.  You have an increasing number of fights with close friends or family members, such as your spouse.  You have thoughts about hurting yourself or others.  You cannot get relief from feelings of depression or anxiety. Follow these instructions at home: Lifestyle  Exercise regularly. Try to do 30 or more minutes of physical activity on most days of the week.  Try to get 7-9 hours of sleep each night. To help with sleep: ? Keep your bedroom cool and dark. ? Avoid screen time before bedtime. This means avoiding use of your TV, computer, tablet, and cell phone.  Practice self-soothing skills and use them daily.  Try to have fun and seek humor in your life. Eating and drinking  Do not eat a heavy meal during the hour before you go to bed.  Do not drink alcohol or caffeinated drinks before bed.  Avoid using alcohol or drugs. General instructions  If your PTSD is affecting your marriage or family, seek help from a family therapist.  Remind yourself that recovering from the trauma is a process and takes time.  Take over-the-counter and prescription medicines only as told by your health care provider.  Make sure to let all of your health care providers know that you have PTSD. This is especially important if you are having surgery or need to be admitted to the hospital.  Keep all follow-up visits as told by your health care providers. This is important. Where to find support Talking to others  Explain that PTSD is a mental health problem. It is something that a person can develop after experiencing or seeing  a life-threatening event. Tell them that PTSD makes you feel stress like you did during the event.  Talk to your loved ones about the symptoms you have. Also tell them what things or situations can cause symptoms to start (are triggers for you).  Assure your loved ones that there are treatments to help PTSD. Discuss possibly seeking family therapy or couples therapy.  If you are worried or fearful about seeking treatment, ask for support.  Keep daily contact with at least one trusted friend or family member. Finances Not all insurance plans cover mental health care, so it is important to check with your insurance carrier. If paying for co-pays or counseling services is a problem, search for a local or county mental health care center. Public mental health care services may be offered there at a low cost or no cost when you are not able to see a private health care provider. If you are a veteran, contact a local veterans organization or veterans hospital for more information. If you are taking medicine for PTSD, you may be able to get the  genericform, which may be less expensive than brand-name medicine. Some makers of prescription medicines also offer help to patients who cannot afford the medicines that they need. Therapy and support groups  Find a support group in your community. Often, groups are available for [employer] veterans, trauma victims, and family members or caregivers.  Look into volunteer opportunities. Taking part in these can help you feel more connected to your community.  Contact a local organization to find out if you are eligible for a service dog. Where to find more information Go to this website to find more information about PTSD, treatment of PTSD, and how to get support:  [facility] for PTSD: www.ptsd.[employer]ball[employer].cz Contact a health care provider if:  Your symptoms get worse or do not get better. Get help right away if:  You have thoughts about hurting yourself or  others. If you ever feel like you may hurt yourself or others, or have thoughts about taking your own life, get help right away. You can go to your nearest emergency department or call:  Your local emergency services (911 in the [place]  A suicide crisis helpline, such as the [place] at (847)353-3476. This is open 24-hours a day. Summary  If you are living with PTSD, there are ways to help you recover from it and manage your symptoms.  Find supportive environments and people who understand PTSD. Spend time in those places, and maintain contact with those people.  Work with your health care team to create a plan for managing PTSD. The plan should include counseling, stress reduction techniques, and healthy lifestyle habits. This information is not intended to replace advice given to you by your health care provider. Make sure you discuss any questions you have with your health care provider. Document Revised: 02/17/2020 Document Reviewed: 02/17/2020 Elsevier Patient Education  2021 [employer].
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
## 2020-11-17 ENCOUNTER — Encounter: Payer: Self-pay | Admitting: Nurse Practitioner

## 2020-11-23 ENCOUNTER — Other Ambulatory Visit: Payer: Self-pay

## 2020-11-23 ENCOUNTER — Other Ambulatory Visit (HOSPITAL_COMMUNITY): Payer: Self-pay

## 2020-11-23 ENCOUNTER — Encounter: Payer: Self-pay | Admitting: Nurse Practitioner

## 2020-11-23 ENCOUNTER — Telehealth (INDEPENDENT_AMBULATORY_CARE_PROVIDER_SITE_OTHER): Payer: BC Managed Care – PPO | Admitting: Family Medicine

## 2020-11-23 DIAGNOSIS — U071 COVID-19: Secondary | ICD-10-CM

## 2020-11-23 MED ORDER — CARESTART COVID-19 HOME TEST VI KIT
PACK | 0 refills | Status: DC
  Filled 2020-11-23: qty 4, 4d supply, fill #0

## 2020-11-23 NOTE — Progress Notes (Signed)
MyChart Video Visit    Virtual Visit via Video Note   This visit type was conducted due to national recommendations for restrictions regarding the COVID-19  Patient location: Home Patient and provider in visit Provider location: Office  I discussed the limitations of evaluation and management by telemedicine and the availability of in person appointments. The patient expressed understanding and agreed to proceed.  Visit Date: 11/23/2020  Today's healthcare provider: Ann Held, DO     Subjective:    Patient ID: Ronald Jackson, male    DOB: December 05, 1980, 40 y.o.   MRN: [id number]  No chief complaint on file.   HPI Patient is in today for + covid---  pt never signed into visit and when cma called --- went right to vm .    Past Medical History:  Diagnosis Date   Migraine     Past Surgical History:  Procedure Laterality Date   DENTAL SURGERY     FOOT SURGERY      Family History  Problem Relation Age of Onset   Diabetes Mother    Drug abuse Father    Early death Father    Diabetes Brother    Heart attack Maternal Grandmother    Alcohol abuse Maternal Grandfather    Alcohol abuse Paternal Grandfather     Social History   Socioeconomic History   Marital status: Married    Spouse name: Not on file   Number of children: 2   Years of education: Not on file   Highest education level: Not on file  Occupational History   Not on file  Tobacco Use   Smoking status: Every Day    Packs/day: 0.50    Pack years: 0.00    Types: Cigarettes   Smokeless tobacco: Never   Tobacco comments:    Menthols  Vaping Use   Vaping Use: Never used  Substance and Sexual Activity   Alcohol use: Yes    Comment: socially   Drug use: Not Currently    Types: Marijuana   Sexual activity: Not on file  Other Topics Concern   Not on file  Social History Narrative   Not on file   Social Determinants of Health   Financial Resource Strain: Not on file  Food  Insecurity: Not on file  Transportation Needs: Not on file  Physical Activity: Not on file  Stress: Not on file  Social Connections: Not on file  Intimate Partner Violence: Not on file    Outpatient Medications Prior to Visit  Medication Sig Dispense Refill   busPIRone (BUSPAR) 7.5 MG tablet Take 1 tablet (7.5 mg total) by mouth 2 (two) times daily. 60 tablet 5   fluocinolone (SYNALAR) 0.025 % ointment Apply topically 2 (two) times daily. 120 g 0   FLUoxetine (PROZAC) 10 MG tablet Take 1 tablet (10 mg total) by mouth daily. 30 tablet 5   methocarbamol (ROBAXIN) 500 MG tablet Take 1 tablet (500 mg total) by mouth every 8 (eight) hours as needed for muscle spasms. 21 tablet 0   methocarbamol (ROBAXIN) 500 MG tablet Take 1 tablet (500 mg total) by mouth 2 (two) times daily. 20 tablet 0   No facility-administered medications prior to visit.    No Known Allergies     Objective:       Diabetic Foot Exam - Simple   No data filed    Lab Results  Component Value Date   WBC 7.5 10/20/2019   HGB 14.2 10/20/2019  HCT 42.1 10/20/2019   PLT 166.0 10/20/2019   GLUCOSE 93 10/20/2019   CHOL 174 10/20/2019   TRIG 35.0 10/20/2019   HDL 41.30 10/20/2019   LDLCALC 126 (H) 10/20/2019   ALT 13 10/20/2019   AST 21 10/20/2019   NA 137 10/20/2019   K 4.1 10/20/2019   CL 104 10/20/2019   CREATININE 1.18 10/20/2019   BUN 16 10/20/2019   CO2 29 10/20/2019   TSH 0.35 10/20/2019    Lab Results  Component Value Date   TSH 0.35 10/20/2019   Lab Results  Component Value Date   WBC 7.5 10/20/2019   HGB 14.2 10/20/2019   HCT 42.1 10/20/2019   MCV 87.8 10/20/2019   PLT 166.0 10/20/2019   Lab Results  Component Value Date   NA 137 10/20/2019   K 4.1 10/20/2019   CO2 29 10/20/2019   GLUCOSE 93 10/20/2019   BUN 16 10/20/2019   CREATININE 1.18 10/20/2019   BILITOT 0.9 10/20/2019   ALKPHOS 43 10/20/2019   AST 21 10/20/2019   ALT 13 10/20/2019   PROT 7.7 10/20/2019   ALBUMIN 4.6  10/20/2019   CALCIUM 9.7 10/20/2019   ANIONGAP 6 08/04/2018   GFR 83.04 10/20/2019   Lab Results  Component Value Date   CHOL 174 10/20/2019   Lab Results  Component Value Date   HDL 41.30 10/20/2019   Lab Results  Component Value Date   LDLCALC 126 (H) 10/20/2019   Lab Results  Component Value Date   TRIG 35.0 10/20/2019   Lab Results  Component Value Date   CHOLHDL 4 10/20/2019   No results found for: HGBA1C     Assessment & Plan:   Problem List Items Addressed This Visit   None     No orders of the defined types were placed in this encounter.   I discussed the assessment and treatment plan with the patient. The patient was provided an opportunity to ask questions and all were answered. The patient agreed with the plan and demonstrated an understanding of the instructions.   The patient was advised to call back or seek an in-person evaluation if the symptoms worsen or if the condition fails to improve as anticipated.     Ann Held, DO [place] [facility] [employer] [facility] [phone] (phone) [phone] (fax)  [place]

## 2020-11-28 ENCOUNTER — Other Ambulatory Visit: Payer: Self-pay

## 2020-11-28 ENCOUNTER — Emergency Department (HOSPITAL_BASED_OUTPATIENT_CLINIC_OR_DEPARTMENT_OTHER): Payer: BC Managed Care – PPO

## 2020-11-28 ENCOUNTER — Emergency Department (HOSPITAL_BASED_OUTPATIENT_CLINIC_OR_DEPARTMENT_OTHER)
Admission: EM | Admit: 2020-11-28 | Discharge: 2020-11-29 | Disposition: A | Discharge status: Home / Self Care | Payer: BC Managed Care – PPO | Attending: Emergency Medicine | Admitting: Emergency Medicine

## 2020-11-28 ENCOUNTER — Emergency Department (HOSPITAL_COMMUNITY): Payer: BC Managed Care – PPO

## 2020-11-28 DIAGNOSIS — F17210 Nicotine dependence, cigarettes, uncomplicated: Secondary | ICD-10-CM | POA: Insufficient documentation

## 2020-11-28 DIAGNOSIS — R0781 Pleurodynia: Secondary | ICD-10-CM

## 2020-11-28 DIAGNOSIS — R079 Chest pain, unspecified: Secondary | ICD-10-CM | POA: Diagnosis present

## 2020-11-28 DIAGNOSIS — U071 COVID-19: Secondary | ICD-10-CM | POA: Diagnosis not present

## 2020-11-28 LAB — CBC
HCT: 35.7 % — ABNORMAL LOW (ref 39.0–52.0)
Hemoglobin: 12.4 g/dL — ABNORMAL LOW (ref 13.0–17.0)
MCH: 28.8 pg (ref 26.0–34.0)
MCHC: 34.7 g/dL (ref 30.0–36.0)
MCV: 83 fL (ref 80.0–100.0)
Platelets: 215 10*3/uL (ref 150–400)
RBC: 4.3 MIL/uL (ref 4.22–5.81)
RDW: 15 % (ref 11.5–15.5)
WBC: 8 10*3/uL (ref 4.0–10.5)
nRBC: 0 % (ref 0.0–0.2)

## 2020-11-28 LAB — BASIC METABOLIC PANEL
Anion gap: 7 (ref 5–15)
BUN: 13 mg/dL (ref 6–20)
CO2: 28 mmol/L (ref 22–32)
Calcium: 8.8 mg/dL — ABNORMAL LOW (ref 8.9–10.3)
Chloride: 103 mmol/L (ref 98–111)
Creatinine, Ser: 1.13 mg/dL (ref 0.61–1.24)
GFR, Estimated: >60 mL/min (ref >60)
Glucose, Bld: 92 mg/dL (ref 70–99)
Potassium: 4 mmol/L (ref 3.5–5.1)
Sodium: 138 mmol/L (ref 135–145)

## 2020-11-28 LAB — D-DIMER, QUANTITATIVE: D-Dimer, Quant: <0.27 ug/mL-FEU (ref 0.00–0.50)

## 2020-11-28 LAB — TROPONIN I (HIGH SENSITIVITY): Troponin I (High Sensitivity): 4 ng/L (ref <18)

## 2020-11-28 MED ORDER — KETOROLAC TROMETHAMINE 15 MG/ML IJ SOLN
15.0000 mg | Freq: Once | INTRAMUSCULAR | Status: AC
  Administered 2020-11-28: 15 mg via INTRAVENOUS
  Filled 2020-11-28: qty 1

## 2020-11-28 NOTE — ED Triage Notes (Addendum)
Pt presents to ED POV. Pt CP L chest and left middle back. CP began 3-4 ago when eating. Pt describes pain as pressure. Pt's son had covid last week

## 2020-11-28 NOTE — ED Provider Notes (Signed)
[facility] DEPT [facility] Provider Note: Georgena Spurling, MD, FACEP  [name]: [id number] MRN: [id number] ARRIVAL: 11/28/20 at 2156 ROOM: [facility]  Chest Pain   HISTORY OF PRESENT ILLNESS  11/28/20 11:04 PM Ronald Jackson is a 40 y.o. male who developed left lower chest pain about 9:30 PM today.  The onset was fairly sudden.  He describes the pain as sharp and pleuritic (worse with breathing).  It causes him to be short of breath by which he means he is taking short rapid breaths to avoid pain.  He has not had a fever, body aches or other COVID symptoms but his son currently has COVID.  He rates his pain as a 10 out of 10.   Past Medical History:  Diagnosis Date   Migraine     Past Surgical History:  Procedure Laterality Date   DENTAL SURGERY     FOOT SURGERY      Family History  Problem Relation Age of Onset   Diabetes Mother    Drug abuse Father    Early death Father    Diabetes Brother    Heart attack Maternal Grandmother    Alcohol abuse Maternal Grandfather    Alcohol abuse Paternal Grandfather     Social History   Tobacco Use   Smoking status: Every Day    Packs/day: 0.50    Pack years: 0.00    Types: Cigarettes   Smokeless tobacco: Never   Tobacco comments:    Menthols  Vaping Use   Vaping Use: Never used  Substance Use Topics   Alcohol use: Yes    Comment: socially   Drug use: Not Currently    Types: Marijuana    Prior to Admission medications   Medication Sig Start Date End Date Taking? Authorizing Provider  busPIRone (BUSPAR) 7.5 MG tablet Take 1 tablet (7.5 mg total) by mouth 2 (two) times daily. 10/04/20  Yes Nche, Charlene Brooke, NP  gabapentin (NEURONTIN) 300 MG capsule Take 300 mg by mouth 3 (three) times daily.   Yes [provider]  naproxen (NAPROSYN) 375 MG tablet Take 1 tablet twice daily as needed for chest pain. 11/29/20  Yes Shley Dolby, MD  fluocinolone (SYNALAR) 0.025 % ointment Apply topically 2 (two) times  daily. 10/19/20   Nche, Charlene Brooke, NP  FLUoxetine (PROZAC) 10 MG tablet Take 1 tablet (10 mg total) by mouth daily. 11/08/20   Nche, Charlene Brooke, NP    Allergies Patient has no known allergies.   REVIEW OF SYSTEMS  Negative except as noted here or in the History of Present Illness.   PHYSICAL EXAMINATION  Initial Vital Signs Blood pressure (!) 138/99, pulse 82, temperature 98.3 F (36.8 C), temperature source Oral, resp. rate 20, height 6\' 2"  (1.88 m), weight 99.8 kg, SpO2 100 %.  Examination General: Well-developed, well-nourished male in no acute distress; appearance consistent with age of record HENT: normocephalic; atraumatic Eyes: pupils equal, round and reactive to light; extraocular muscles intact Neck: supple Heart: regular rate and rhythm Lungs: clear to auscultation bilaterally; shallow breaths Chest: Nontender Abdomen: soft; nondistended; nontender; bowel sounds present Extremities: No deformity; full range of motion; pulses normal Neurologic: Awake, alert and oriented; motor function intact in all extremities and symmetric; no facial droop Skin: Warm and dry Psychiatric: Normal mood and affect   RESULTS  Summary of this visit's results, reviewed and interpreted by myself:   EKG Interpretation  Date/Time:  Wednesday November 28 2020 22:05:56 EDT Ventricular Rate:  77 PR Interval:  148 QRS Duration: 76 QT Interval:  357 QTC Calculation: 404 R Axis:   74 Text Interpretation: Sinus rhythm Atrial premature complex Probable left atrial enlargement Probable left ventricular hypertrophy Artifact Otherwise no significant change Confirmed by Fredia Sorrow [phone]) on 11/28/2020 10:12:34 PM        Laboratory Studies: Results for orders placed or performed during the hospital encounter of 11/28/20 (from the past 24 hour(s))  Basic metabolic panel     Status: Abnormal   Collection Time: 11/28/20 10:18 PM  Result Value Ref Range   Sodium 138 135 - 145 mmol/L    Potassium 4.0 3.5 - 5.1 mmol/L   Chloride 103 98 - 111 mmol/L   CO2 28 22 - 32 mmol/L   Glucose, Bld 92 70 - 99 mg/dL   BUN 13 6 - 20 mg/dL   Creatinine, Ser 1.13 0.61 - 1.24 mg/dL   Calcium 8.8 (L) 8.9 - 10.3 mg/dL   GFR, Estimated >60 >60 mL/min   Anion gap 7 5 - 15  CBC     Status: Abnormal   Collection Time: 11/28/20 10:18 PM  Result Value Ref Range   WBC 8.0 4.0 - 10.5 K/uL   RBC 4.30 4.22 - 5.81 MIL/uL   Hemoglobin 12.4 (L) 13.0 - 17.0 g/dL   HCT 35.7 (L) 39.0 - 52.0 %   MCV 83.0 80.0 - 100.0 fL   MCH 28.8 26.0 - 34.0 pg   MCHC 34.7 30.0 - 36.0 g/dL   RDW 15.0 11.5 - 15.5 %   Platelets 215 150 - 400 K/uL   nRBC 0.0 0.0 - 0.2 %  Troponin I (High Sensitivity)     Status: None   Collection Time: 11/28/20 10:18 PM  Result Value Ref Range   Troponin I (High Sensitivity) 4 <18 ng/L  D-dimer, quantitative     Status: None   Collection Time: 11/28/20 11:11 PM  Result Value Ref Range   D-Dimer, Quant <0.27 0.00 - 0.50 ug/mL-FEU  Resp Panel by RT-PCR (Flu A&B, Covid) Nasopharyngeal Swab     Status: Abnormal   Collection Time: 11/28/20 11:11 PM   Specimen: Nasopharyngeal Swab; Nasopharyngeal(NP) swabs in vial transport medium  Result Value Ref Range   SARS Coronavirus 2 by RT PCR POSITIVE (A) NEGATIVE   Influenza A by PCR NEGATIVE NEGATIVE   Influenza B by PCR NEGATIVE NEGATIVE   Imaging Studies: DG Chest 2 View  Result Date: 11/28/2020 CLINICAL DATA:  Left-sided chest pain beginning this evening. EXAM: CHEST - 2 VIEW COMPARISON:  08/30/2020 FINDINGS: Heart size and pulmonary vascularity are normal for technique. Infiltration or atelectasis in both lung bases, possibly pneumonia. No pleural effusions. No pneumothorax. Mediastinal contours appear intact. IMPRESSION: Infiltration or atelectasis in the lung bases. Electronically Signed   By: Lucienne Capers M.D.   On: 11/28/2020 22:36    ED COURSE and MDM  Nursing notes, initial and subsequent vitals signs, including pulse  oximetry, reviewed and interpreted by myself.  Vitals:   11/28/20 2210 11/28/20 2312 11/28/20 2330 11/29/20 0000  BP:  (!) 134/92 (!) 133/94 (!) 131/101  Pulse: 82 74 74 76  Resp: 20 (!) 29 (!) 26 (!) 27  Temp:      TempSrc:      SpO2: 100% 99% 99% 99%  Weight:      Height:       Medications  ketorolac (TORADOL) 15 MG/ML injection 15 mg (15 mg Intravenous Given 11/28/20 2317)   Patient positive  for COVID-19.  D-dimer is within normal limits.  Patient got symptomatic relief with IV Toradol.  He does not meet any of the risk categories for Paxil of it at this time.  He was advised to return for shortness of breath or other worsening symptoms.   PROCEDURES  Procedures   ED DIAGNOSES     ICD-10-CM   1. COVID-19 virus infection  U07.1     2. Pleuritic chest pain  R07.81          Shanon Rosser, MD 11/29/20 [phone]

## 2020-11-29 LAB — RESP PANEL BY RT-PCR (FLU A&B, COVID) ARPGX2
Influenza A by PCR: NEGATIVE
Influenza B by PCR: NEGATIVE
SARS Coronavirus 2 by RT PCR: POSITIVE — AB

## 2020-11-29 LAB — TROPONIN I (HIGH SENSITIVITY): Troponin I (High Sensitivity): 4 ng/L (ref <18)

## 2020-11-29 MED ORDER — NAPROXEN 375 MG PO TABS: ORAL_TABLET | ORAL | 0 refills | Status: DC

## 2020-12-04 ENCOUNTER — Other Ambulatory Visit: Payer: Self-pay

## 2020-12-05 ENCOUNTER — Encounter: Payer: Self-pay | Admitting: Nurse Practitioner

## 2020-12-05 ENCOUNTER — Telehealth (INDEPENDENT_AMBULATORY_CARE_PROVIDER_SITE_OTHER): Payer: BC Managed Care – PPO | Admitting: Nurse Practitioner

## 2020-12-05 VITALS — BP 114/74 | Ht 75.0 in | Wt 233.0 lb

## 2020-12-05 DIAGNOSIS — R351 Nocturia: Secondary | ICD-10-CM | POA: Diagnosis not present

## 2020-12-05 DIAGNOSIS — F411 Generalized anxiety disorder: Secondary | ICD-10-CM

## 2020-12-05 NOTE — Progress Notes (Signed)
Virtual Visit via Video Note  I connected withNAME@ on 12/05/20 at  9:00 AM EDT by a video enabled telemedicine application and verified that I am speaking with the correct person using two identifiers.  Location: Patient:Home Provider: Office Participants: patient and provider  I discussed the limitations of evaluation and management by telemedicine and the availability of in person appointments. I also discussed with the patient that there may be a patient responsible charge related to this service. The patient expressed understanding and agreed to proceed.  KX:FGHWEXH f/up, nocturia  History of Present Illness: Resolved COVID symotms, onset of symptoms 11/26/2020 Urinary Frequency  This is a chronic problem. The current episode started more than 1 month ago. The problem occurs every urination. The problem has been gradually worsening. The pain is at a severity of 0/10. The patient is experiencing no pain. There has been no fever. He is Sexually active. There is No history of pyelonephritis. Associated symptoms include frequency, hesitancy and urgency. Pertinent negatives include no chills, discharge, flank pain, hematuria, nausea, possible pregnancy, sweats or vomiting. He has tried nothing for the symptoms. There is no history of catheterization, recurrent UTIs or urinary stasis.   Fhx of prostate cancer (paternal uncles)  GAD (generalized anxiety disorder) No change in mood. Does not take prozac daily Did not schedule appt with psychologist.  Advised about importance of medication compliance and the benefits of psychotherapy. F/up in 10month   Observations/Objective: Physical Exam Pulmonary:     Effort: Pulmonary effort is normal.  Neurological:     Mental Status: He is alert and oriented to person, place, and time.  Psychiatric:        Mood and Affect: Mood normal.        Behavior: Behavior normal.        Thought Content: Thought content normal.    Assessment and  Plan: Ryott was seen today for uri and follow-up.  Diagnoses and all orders for this visit:  GAD (generalized anxiety disorder)  Nocturia more than twice per night -     Urinalysis; Future -     Urine cytology ancillary only([facility]); Future -     PSA; Future   Follow Up Instructions: See above. He is to go to [address] for blood draw and urine collection   I discussed the assessment and treatment plan with the patient. The patient was provided an opportunity to ask questions and all were answered. The patient agreed with the plan and demonstrated an understanding of the instructions.   The patient was advised to call back or seek an in-person evaluation if the symptoms worsen or if the condition fails to improve as anticipated.  [name], NP

## 2020-12-05 NOTE — Assessment & Plan Note (Signed)
No change in mood. Does not take prozac daily Did not schedule appt with psychologist.  Advised about importance of medication compliance and the benefits of psychotherapy. F/up in 54month

## 2021-01-10 ENCOUNTER — Other Ambulatory Visit: Payer: Self-pay

## 2021-01-11 ENCOUNTER — Ambulatory Visit: Payer: BC Managed Care – PPO | Admitting: Nurse Practitioner

## 2021-04-24 ENCOUNTER — Other Ambulatory Visit: Payer: Self-pay

## 2021-04-24 ENCOUNTER — Ambulatory Visit (INDEPENDENT_AMBULATORY_CARE_PROVIDER_SITE_OTHER): Payer: BC Managed Care – PPO | Admitting: Nurse Practitioner

## 2021-04-24 ENCOUNTER — Encounter: Payer: Self-pay | Admitting: Nurse Practitioner

## 2021-04-24 VITALS — BP 100/74 | HR 70 | Temp 97.5°F | Ht 74.0 in | Wt 226.8 lb

## 2021-04-24 DIAGNOSIS — F411 Generalized anxiety disorder: Secondary | ICD-10-CM | POA: Diagnosis not present

## 2021-04-24 DIAGNOSIS — Z720 Tobacco use: Secondary | ICD-10-CM | POA: Diagnosis not present

## 2021-04-24 DIAGNOSIS — R1013 Epigastric pain: Secondary | ICD-10-CM

## 2021-04-24 MED ORDER — NICOTINE 21 MG/24HR TD PT24
21.0000 mg | MEDICATED_PATCH | Freq: Every day | TRANSDERMAL | 0 refills | Status: DC
Start: 2021-04-24 — End: 2021-08-27

## 2021-04-24 MED ORDER — NICOTINE 7 MG/24HR TD PT24: 7.0000 mg | MEDICATED_PATCH | Freq: Every day | TRANSDERMAL | 0 refills | Status: DC

## 2021-04-24 MED ORDER — NICOTINE 14 MG/24HR TD PT24: 14.0000 mg | MEDICATED_PATCH | Freq: Every day | TRANSDERMAL | 0 refills | Status: DC

## 2021-04-24 MED ORDER — OMEPRAZOLE 20 MG PO CPDR: 20.0000 mg | DELAYED_RELEASE_CAPSULE | Freq: Two times a day (BID) | ORAL | 0 refills | Status: DC

## 2021-04-24 NOTE — Progress Notes (Signed)
Subjective:  Patient ID: Ronald Jackson, male    DOB: 12-20-1980  Age: 40 y.o. MRN: [id number]  CC: Acute Visit (Pt c/o a migraine x 2 weeks, pt has took otc medication (Goodies and Excedrin) with relief that only last maybe a day. )   Headache  This is a new problem. The current episode started 1 to 4 weeks ago. The problem occurs intermittently. The problem has been unchanged. The pain is located in the Parietal region. The pain does not radiate. The pain quality is not similar to prior headaches. The quality of the pain is described as aching. Pertinent negatives include no abdominal pain, abnormal behavior, anorexia, back pain, blurred vision, coughing, dizziness, drainage, ear pain, eye pain, eye redness, eye watering, facial sweating, fever, hearing loss, insomnia, loss of balance, muscle aches, nausea, neck pain, numbness, phonophobia, photophobia, rhinorrhea, scalp tenderness, seizures, sinus pressure, sore throat, swollen glands, tingling, tinnitus, visual change, vomiting or weight loss. Nothing aggravates the symptoms. He has tried Excedrin for the symptoms. The treatment provided significant relief. There is no history of cancer, cluster headaches, hypertension, immunosuppression, migraine headaches, migraines in the family, obesity, pseudotumor cerebri, recent head traumas, sinus disease or TMJ.   Tobacco use States he is ready to quit. Does not have quit date No previous attempt to quit. 1/2ppd x 25years. We discussed use of nicotine gum vs nicotine patch vs chantix vs zyban. We discussed possible side effects of zyban vs chantix. He opted to use nicotine patch. Sent 21mg , 14mg  and 7mg  patches. Provided instructions on how to titrate  GAD (generalized anxiety disorder) Declined appt with therapist due to previous negative experience Declined to use medication at this time No SI/HI or hallucination Advised to quit marijuana use due to risk of exacerbating anxiety and  depression  Dyspepsia Reports increase nausea with food intake. Daily marijuana use  Advised about possible side effects of chronic marijuana: nausea, ABD pain, headache, and mood disorder. Advised to discontinue use Start omeprazole 20mg  BID, eat small frequent meals, avoid high fat/spicy foods  Wt Readings from Last 3 Encounters:  04/24/21 226 lb 12.8 oz (102.9 kg)  12/05/20 233 lb (105.7 kg)  11/28/20 220 lb (99.8 kg)    BP Readings from Last 3 Encounters:  04/24/21 100/74  12/05/20 114/74  11/29/20 (!) 131/101    Reviewed past Medical, Social and Family history today.  Outpatient Medications Prior to Visit  Medication Sig Dispense Refill   fluocinolone (SYNALAR) 0.025 % ointment Apply topically 2 (two) times daily. 120 g 0   methocarbamol (ROBAXIN) 500 MG tablet Take 1 tablet by mouth 2 (two) times daily.     naproxen (NAPROSYN) 375 MG tablet Take 1 tablet twice daily as needed for chest pain. 20 tablet 0   FLUoxetine (PROZAC) 10 MG tablet Take 1 tablet (10 mg total) by mouth daily. 30 tablet 5   busPIRone (BUSPAR) 7.5 MG tablet Take 1 tablet (7.5 mg total) by mouth 2 (two) times daily. (Patient not taking: Reported on 04/24/2021) 60 tablet 5   gabapentin (NEURONTIN) 300 MG capsule Take 300 mg by mouth 3 (three) times daily. (Patient not taking: Reported on 04/24/2021)     No facility-administered medications prior to visit.    ROS See HPI  Objective:  BP 100/74 (BP Location: Right Arm, Patient Position: Sitting, Cuff Size: Large)   Pulse 70   Temp (!) 97.5 F (36.4 C) (Temporal)   Ht 6\' 2"  (1.88 m)   Wt 226 lb 12.8  oz (102.9 kg)   SpO2 100%   BMI 29.12 kg/m   Physical Exam Vitals reviewed.  Eyes:     Extraocular Movements: Extraocular movements intact.     Conjunctiva/sclera: Conjunctivae normal.  Cardiovascular:     Rate and Rhythm: Normal rate.     Pulses: Normal pulses.  Pulmonary:     Effort: Pulmonary effort is normal.  Neurological:     Mental Status:  He is alert and oriented to person, place, and time.     Cranial Nerves: No cranial nerve deficit.  Psychiatric:        Mood and Affect: Mood normal.        Behavior: Behavior normal.    Assessment & Plan:  This visit occurred during the SARS-CoV-2 public health emergency.  Safety protocols were in place, including screening questions prior to the visit, additional usage of staff PPE, and extensive cleaning of exam room while observing appropriate contact time as indicated for disinfecting solutions.   Ronald Jackson was seen today for acute visit.  Diagnoses and all orders for this visit:  Dyspepsia -     omeprazole (PRILOSEC) 20 MG capsule; Take 1 capsule (20 mg total) by mouth 2 (two) times daily before a meal.  Tobacco use -     nicotine (NICODERM CQ) 21 mg/24hr patch; Place 1 patch (21 mg total) onto the skin daily. -     nicotine (NICODERM CQ) 14 mg/24hr patch; Place 1 patch (14 mg total) onto the skin daily. -     nicotine (NICODERM CQ) 7 mg/24hr patch; Place 1 patch (7 mg total) onto the skin daily.  GAD (generalized anxiety disorder)   Problem List Items Addressed This Visit       Other   Dyspepsia - Primary    Reports increase nausea with food intake. Daily marijuana use  Advised about possible side effects of chronic marijuana: nausea, ABD pain, headache, and mood disorder. Advised to discontinue use Start omeprazole 20mg  BID, eat small frequent meals, avoid high fat/spicy foods       Relevant Medications   omeprazole (PRILOSEC) 20 MG capsule   GAD (generalized anxiety disorder)    Declined appt with therapist due to previous negative experience Declined to use medication at this time No SI/HI or hallucination Advised to quit marijuana use due to risk of exacerbating anxiety and depression      Tobacco use    States he is ready to quit. Does not have quit date No previous attempt to quit. 1/2ppd x 25years. We discussed use of nicotine gum vs nicotine patch vs  chantix vs zyban. We discussed possible side effects of zyban vs chantix. He opted to use nicotine patch. Sent 21mg , 14mg  and 7mg  patches. Provided instructions on how to titrate      Relevant Medications   nicotine (NICODERM CQ) 21 mg/24hr patch   nicotine (NICODERM CQ) 14 mg/24hr patch   nicotine (NICODERM CQ) 7 mg/24hr patch    I have spent 5mins with this patient regarding history taking, documentation, formulating plan and discussing treatment options with patient.  Follow-up: Return if symptoms worsen or fail to improve.  [name], NP

## 2021-04-24 NOTE — Assessment & Plan Note (Signed)
Declined appt with therapist due to previous negative experience Declined to use medication at this time No SI/HI or hallucination Advised to quit marijuana use due to risk of exacerbating anxiety and depression

## 2021-04-24 NOTE — Assessment & Plan Note (Signed)
Reports increase nausea with food intake. Daily marijuana use  Advised about possible side effects of chronic marijuana: nausea, ABD pain, headache, and mood disorder. Advised to discontinue use Start omeprazole 20mg  BID, eat small frequent meals, avoid high fat/spicy foods

## 2021-04-24 NOTE — Patient Instructions (Signed)
Use psychologytoday.com to schedule appt with therapist.  Stop marijuana use Work on tobacco cessation.  Use excedrin prn for headache. Take with food  Start omeprazole 20mg  BID x 2weeks.  Smoking Tobacco Information, Adult Smoking tobacco can be harmful to your health. Tobacco contains a poisonous (toxic), colorless chemical called nicotine. Nicotine is addictive. It changes the brain and can make it hard to stop smoking. Tobacco also has other toxic chemicals that can hurt your body and raise your risk of many cancers. How can smoking tobacco affect me? Smoking tobacco puts you at risk for: Cancer. Smoking is most commonly associated with lung cancer, but can also lead to cancer in other parts of the body. Chronic obstructive pulmonary disease (COPD). This is a long-term lung condition that makes it hard to breathe. It also gets worse over time. High blood pressure (hypertension), heart disease, stroke, or heart attack. Lung infections, such as pneumonia. Cataracts. This is when the lenses in the eyes become clouded. Digestive problems. This may include peptic ulcers, heartburn, and gastroesophageal reflux disease (GERD). Oral health problems, such as gum disease and tooth loss. Loss of taste and smell. Smoking can affect your appearance by causing: Wrinkles. Yellow or stained teeth, fingers, and fingernails. Smoking tobacco can also affect your social life, because: It may be challenging to find places to smoke when away from home. Many workplaces, [employer], hotels, and public places are tobacco-free. Smoking is expensive. This is due to the cost of tobacco and the long-term costs of treating health problems from smoking. Secondhand smoke may affect those around you. Secondhand smoke can cause lung cancer, breathing problems, and heart disease. Children of smokers have a higher risk for: Sudden infant death syndrome (SIDS). Ear infections. Lung infections. If you currently smoke  tobacco, quitting now can help you: Lead a longer and healthier life. Look, smell, breathe, and feel better over time. Save money. Protect others from the harms of secondhand smoke. What actions can I take to prevent health problems? Quit smoking  Do not start smoking. Quit if you already do. Make a plan to quit smoking and commit to it. Look for programs to help you and ask your health care provider for recommendations and ideas. Set a date and write down all the reasons you want to quit. Let your friends and family know you are quitting so they can help and support you. Consider finding friends who also want to quit. It can be easier to quit with someone else, so that you can support each other. Talk with your health care provider about using nicotine replacement medicines to help you quit, such as gum, lozenges, patches, sprays, or pills. Do not replace cigarette smoking with electronic cigarettes, which are commonly called e-cigarettes. The safety of e-cigarettes is not known, and some may contain harmful chemicals. If you try to quit but return to smoking, stay positive. It is common to slip up when you first quit, so take it one day at a time. Be prepared for cravings. When you feel the urge to smoke, chew gum or suck on hard candy. Lifestyle Stay busy and take care of your body. Drink enough fluid to keep your urine pale yellow. Get plenty of exercise and eat a healthy diet. This can help prevent weight gain after quitting. Monitor your eating habits. Quitting smoking can cause you to have a larger appetite than when you smoke. Find ways to relax. Go out with friends or family to a movie or a restaurant where  people do not smoke. Ask your health care provider about having regular tests (screenings) to check for cancer. This may include blood tests, imaging tests, and other tests. Find ways to manage your stress, such as meditation, yoga, or exercise. Where to find support To get  support to quit smoking, consider: Asking your health care provider for more information and resources. Taking classes to learn more about quitting smoking. Looking for local organizations that offer resources about quitting smoking. Joining a support group for people who want to quit smoking in your local community. Calling the smokefree.gov counselor helpline: 1-800-Quit-Now 319 094 9790) Where to find more information You may find more information about quitting smoking from: HelpGuide.org: www.helpguide.org https://hall.com/: smokefree.gov American Lung Association: www.lung.org Contact a health care provider if you: Have problems breathing. Notice that your lips, nose, or fingers turn blue. Have chest pain. Are coughing up blood. Feel faint or you pass out. Have other health changes that cause you to worry. Summary Smoking tobacco can negatively affect your health, the health of those around you, your finances, and your social life. Do not start smoking. Quit if you already do. If you need help quitting, ask your health care provider. Think about joining a support group for people who want to quit smoking in your local community. There are many effective programs that will help you to quit this behavior. This information is not intended to replace advice given to you by your health care provider. Make sure you discuss any questions you have with your health care provider. Document Revised: 02/04/2021 Document Reviewed: 04/24/2020 Elsevier Patient Education  2022 [employer].

## 2021-04-24 NOTE — Assessment & Plan Note (Signed)
States he is ready to quit. Does not have quit date No previous attempt to quit. 1/2ppd x 25years. We discussed use of nicotine gum vs nicotine patch vs chantix vs zyban. We discussed possible side effects of zyban vs chantix. He opted to use nicotine patch. Sent 21mg , 14mg  and 7mg  patches. Provided instructions on how to titrate

## 2021-05-15 ENCOUNTER — Ambulatory Visit: Payer: BC Managed Care – PPO | Admitting: Nurse Practitioner

## 2021-05-16 ENCOUNTER — Ambulatory Visit (INDEPENDENT_AMBULATORY_CARE_PROVIDER_SITE_OTHER): Payer: BC Managed Care – PPO | Admitting: Podiatry

## 2021-05-16 ENCOUNTER — Other Ambulatory Visit: Payer: Self-pay

## 2021-05-16 DIAGNOSIS — B351 Tinea unguium: Secondary | ICD-10-CM | POA: Diagnosis not present

## 2021-05-16 DIAGNOSIS — L600 Ingrowing nail: Secondary | ICD-10-CM | POA: Diagnosis not present

## 2021-05-16 MED ORDER — TERBINAFINE HCL 250 MG PO TABS: 250.0000 mg | ORAL_TABLET | Freq: Every day | ORAL | 0 refills | Status: AC

## 2021-05-16 MED ORDER — NEOMYCIN-POLYMYXIN-HC 1 % OT SOLN: OTIC | 0 refills | Status: DC

## 2021-05-16 NOTE — Patient Instructions (Signed)

## 2021-05-20 NOTE — Progress Notes (Signed)
  Subjective:  Patient ID: Ronald Jackson, male    DOB: April 08, 1981,  MRN: [id number]  Chief Complaint  Patient presents with   Nail Problem     NP R ingrown nail , toe nail fungus     40 y.o. male presents with the above complaint. History confirmed with patient.  This is a new issue with brown discoloration and thickening of the nails which are curved in and cause pain on both great toes  Objective:  Physical Exam: warm, good capillary refill, no trophic changes or ulcerative lesions, normal DP and PT pulses, normal sensory exam, and he has onychomycosis of all toenails with brown discolored nail and subungual debris, pincer nail deformity with ingrowing nails both borders of the bilateral hallux painful to touch.  Assessment:  No diagnosis found.   Plan:  Patient was evaluated and treated and all questions answered.    Ingrown Nail, bilaterally -Patient elects to proceed with minor surgery to remove ingrown toenail today. Consent reviewed and signed by patient. -Ingrown nail excised. See procedure note. -Educated on post-procedure care including soaking. Written instructions provided and reviewed. -Patient to follow up in 2 weeks for nail check.  Procedure: Excision of Ingrown Toenail Location: Bilateral 1st toe  medial and lateral  nail borders. Anesthesia: Lidocaine 1% plain; 1.5 mL and Marcaine 0.5% plain; 1.5 mL, digital block. Skin Prep: Betadine. Dressing: Silvadene; telfa; dry, sterile, compression dressing. Technique: Following skin prep, the toe was exsanguinated and a tourniquet was secured at the base of the toe. The affected nail border was freed, split with a nail splitter, and excised. Chemical matrixectomy was then performed with phenol and irrigated out with alcohol. The tourniquet was then removed and sterile dressing applied. Disposition: Patient tolerated procedure well. Patient to return in 2 weeks for follow-up.    We also discussed etiology and treatment  options of onychomycosis.  I recommended oral treatment with terbinafine.  He has no history of liver disease and his last LFTs were normal.  90-day course of Lamisil sent to pharmacy.   Return in about 2 weeks (around 05/30/2021) for nail re-check.

## 2021-06-20 ENCOUNTER — Other Ambulatory Visit: Payer: Self-pay

## 2021-06-20 ENCOUNTER — Telehealth: Payer: Self-pay | Admitting: Nurse Practitioner

## 2021-06-20 DIAGNOSIS — F4310 Post-traumatic stress disorder, unspecified: Secondary | ICD-10-CM

## 2021-06-20 DIAGNOSIS — F411 Generalized anxiety disorder: Secondary | ICD-10-CM

## 2021-06-27 NOTE — Telephone Encounter (Signed)
No forms have been received for this patient

## 2021-07-04 ENCOUNTER — Telehealth (INDEPENDENT_AMBULATORY_CARE_PROVIDER_SITE_OTHER): Payer: BC Managed Care – PPO | Admitting: Family Medicine

## 2021-07-04 ENCOUNTER — Encounter: Payer: Self-pay | Admitting: Family Medicine

## 2021-07-04 DIAGNOSIS — R6889 Other general symptoms and signs: Secondary | ICD-10-CM

## 2021-07-04 NOTE — Patient Instructions (Addendum)
°  HOME CARE TIPS:  -COVID19 testing information: [email or web address].[email or web address]  Most pharmacies also offer testing and home test kits. If the Covid19 test is positive, you are high risk for severe disease, it is within the first 5 days of illness and you desire antiviral treatment, please contact a [facility] or schedule a follow up virtual visit through your primary care office or through the [facility].  Other test to treat options: ConnectRV.is?[email or web address]   -can use tylenol or aleve if needed for fevers, aches and pains per instructions  -can use nasal saline a few times per day if you have nasal congestion  -warm slat water gargles for sore throat if needed  -stay hydrated, drink plenty of fluids and eat small healthy meals - avoid dairy  -can take 1000 IU (38mcg) Vit D3 and 100-500 mg of Vit C daily per instructions  -If the Covid test is positive, check out the [facility] website for more information on home care, transmission and treatment for COVID19  -follow up with your doctor in 2-3 days unless improving and feeling better  -stay home while sick, except to seek medical care. If you have COVID19, you will likely be contagious for 7-10 days. Flu or Influenza is likely contagious for about 7 days. Other respiratory viral infections remain contagious for 5-10+ days depending on the virus and many other factors. Wear a good mask that fits snugly (such as N95 or KN95) if around others to reduce the risk of transmission.  It was nice to meet you today, and I really hope you are feeling better soon. I help [facility] out with telemedicine visits on Tuesdays and Thursdays and am happy to help if you need a follow up virtual visit on those days. Otherwise, if you have any concerns or questions following this visit please schedule a follow up visit with your Primary Care doctor or seek care at a local urgent care clinic to avoid delays in  care.    Seek in person care or schedule a follow up video visit promptly if your symptoms worsen, new concerns arise or you are not improving with treatment. Call 911 and/or seek emergency care if your symptoms are severe or life threatening.

## 2021-07-04 NOTE — Progress Notes (Signed)
Virtual Visit via Video Note  I connected with Ronald Jackson  on 07/04/21 at 11:20 AM EST by a video enabled telemedicine application and verified that I am speaking with the correct person using two identifiers.  Location patient: [place] Location provider:work or home office Persons participating in the virtual visit: patient, provider  I discussed the limitations and requested verbal permission for telemedicine visit. The patient expressed understanding and agreed to proceed.   HPI:  Acute telemedicine visit for flu like illness: -Onset: started about 2 days ago -Symptoms include:sore throat, nasal congestion, headache, a little SOB yesterday but resolved today, fever yesterday, body aches yesterday -today doing much better today -has kids in daycare -Denies:NVD, CP, SOB -Has tried:tylenol -Pertinent past medical history: see below -Pertinent medication allergies:No Known Allergies -COVID-19 vaccine status:  Immunization History  Administered Date(s) Administered   Influenza-Unspecified 04/16/2018   PFIZER(Purple Top)SARS-COV-2 Vaccination 09/16/2019, 10/12/2019     ROS: See pertinent positives and negatives per HPI.  Past Medical History:  Diagnosis Date   Migraine     Past Surgical History:  Procedure Laterality Date   DENTAL SURGERY     FOOT SURGERY       Current Outpatient Medications:    fluocinolone (SYNALAR) 0.025 % ointment, Apply topically 2 (two) times daily., Disp: 120 g, Rfl: 0   methocarbamol (ROBAXIN) 500 MG tablet, Take 1 tablet by mouth 2 (two) times daily., Disp: , Rfl:    naproxen (NAPROSYN) 375 MG tablet, Take 1 tablet twice daily as needed for chest pain., Disp: 20 tablet, Rfl: 0   NEOMYCIN-POLYMYXIN-HYDROCORTISONE (CORTISPORIN) 1 % SOLN OTIC solution, Apply to nail beds from procedure site twice daily after soaks, Disp: 10 mL, Rfl: 0   nicotine (NICODERM CQ) 14 mg/24hr patch, Place 1 patch (14 mg total) onto the skin daily., Disp: 28 patch, Rfl: 0    nicotine (NICODERM CQ) 21 mg/24hr patch, Place 1 patch (21 mg total) onto the skin daily., Disp: 28 patch, Rfl: 0   nicotine (NICODERM CQ) 7 mg/24hr patch, Place 1 patch (7 mg total) onto the skin daily., Disp: 28 patch, Rfl: 0   omeprazole (PRILOSEC) 20 MG capsule, Take 1 capsule (20 mg total) by mouth 2 (two) times daily before a meal., Disp: 30 capsule, Rfl: 0   terbinafine (LAMISIL) 250 MG tablet, Take 1 tablet (250 mg total) by mouth daily., Disp: 90 tablet, Rfl: 0  EXAM:  VITALS per patient if applicable:  GENERAL: alert, oriented, appears well and in no acute distress  HEENT: atraumatic, conjunttiva clear, no obvious abnormalities on inspection of external nose and ears  NECK: normal movements of the head and neck  LUNGS: on inspection no signs of respiratory distress, breathing rate appears normal, no obvious gross SOB, gasping or wheezing  CV: no obvious cyanosis  MS: moves all visible extremities without noticeable abnormality  PSYCH/NEURO: pleasant and cooperative, no obvious depression or anxiety, speech and thought processing grossly intact  ASSESSMENT AND PLAN:  Discussed the following assessment and plan:  Flu-like symptoms  -we discussed possible serious and likely etiologies, options for evaluation and workup, limitations of telemedicine visit vs in person visit, treatment, treatment risks and precautions. Pt is agreeable to treatment via telemedicine at this moment. Query influenza, covid, VURI  vs other. He feels he is doing much better today and decline any RX. Discussed symptomatic care with salt water gargles, nasal saline, staying hydrated, rest. He agrees to self covid test and discussed contagious period if positive.  Work/School slipped offered:  declined Advised to seek prompt virtual visit or in person care if worsening, new symptoms arise, or symptoms do not resolve as expected per our conversation of expected course. Discussed options for follow up care.  Did let this patient know that I do telemedicine on Tuesdays and Thursdays for [facility] and those are the days I am logged into the system. Advised to schedule follow up visit with PCP, [facility] virtual visits or [facility] if any further questions or concerns to avoid delays in care.   I discussed the assessment and treatment plan with the patient. The patient was provided an opportunity to ask questions and all were answered. The patient agreed with the plan and demonstrated an understanding of the instructions.     Lucretia Kern, DO

## 2021-08-12 ENCOUNTER — Telehealth (INDEPENDENT_AMBULATORY_CARE_PROVIDER_SITE_OTHER): Payer: BC Managed Care – PPO | Admitting: Nurse Practitioner

## 2021-08-12 ENCOUNTER — Encounter: Payer: Self-pay | Admitting: Nurse Practitioner

## 2021-08-12 DIAGNOSIS — J0110 Acute frontal sinusitis, unspecified: Secondary | ICD-10-CM

## 2021-08-12 MED ORDER — AMOXICILLIN-POT CLAVULANATE 875-125 MG PO TABS: 1.0000 | ORAL_TABLET | Freq: Two times a day (BID) | ORAL | 0 refills | Status: DC

## 2021-08-12 NOTE — Patient Instructions (Signed)
It was great to see you!  Start augmentin (antibiotic) twice a day for 10 days. Drink plenty of fluids and get plenty of rest. You can use flonase nasal spray daily to help with your symptoms as well.  Let's follow-up if your symptoms don't improve or worsen.   If a referral was placed today, you will be contacted for an appointment. Please note that routine referrals can sometimes take up to 3-4 weeks to process. Please call our office if you haven't heard anything after this time frame.  Take care,  Vance Peper, NP
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
## 2021-08-13 ENCOUNTER — Other Ambulatory Visit: Payer: Self-pay

## 2021-08-13 ENCOUNTER — Ambulatory Visit: Payer: BC Managed Care – PPO | Admitting: Nurse Practitioner

## 2021-08-13 DIAGNOSIS — R519 Headache, unspecified: Secondary | ICD-10-CM

## 2021-08-23 ENCOUNTER — Telehealth: Payer: BC Managed Care – PPO | Admitting: Nurse Practitioner

## 2021-08-27 ENCOUNTER — Encounter: Payer: Self-pay | Admitting: Nurse Practitioner

## 2021-08-27 ENCOUNTER — Telehealth (INDEPENDENT_AMBULATORY_CARE_PROVIDER_SITE_OTHER): Payer: BC Managed Care – PPO | Admitting: Nurse Practitioner

## 2021-08-27 VITALS — Ht 75.0 in | Wt 223.0 lb

## 2021-08-27 DIAGNOSIS — G44209 Tension-type headache, unspecified, not intractable: Secondary | ICD-10-CM | POA: Diagnosis not present

## 2021-08-27 DIAGNOSIS — L989 Disorder of the skin and subcutaneous tissue, unspecified: Secondary | ICD-10-CM | POA: Diagnosis not present

## 2021-08-27 MED ORDER — EXCEDRIN MIGRAINE 250-250-65 MG PO TABS: 1.0000 | ORAL_TABLET | Freq: Four times a day (QID) | ORAL | 0 refills | Status: AC | PRN

## 2021-08-27 MED ORDER — EXCEDRIN MIGRAINE 250-250-65 MG PO TABS: 1.0000 | ORAL_TABLET | Freq: Four times a day (QID) | ORAL | 0 refills | Status: DC | PRN

## 2021-08-27 NOTE — Patient Instructions (Addendum)
Stop tylenol and ibuprofen ?Use excedrin for headache. ?Schedule appt with neurology as requested. ?You will be contacted to schedule appt with dermatology. ? ?Tension Headache, Adult ?A tension headache is a feeling of pain, pressure, or aching over the front and sides of the head. The pain can be dull, or it can feel tight. There are two types of tension headache: ?Episodic tension headache. This is when the headaches happen fewer than 15 days a month. ?Chronic tension headache. This is when the headaches happen more than 15 days a month during a 5-monthperiod. ?A tension headache can last from 30 minutes to several days. It is the most common kind of headache. Tension headaches are not normally associated with nausea or vomiting, and they do not get worse with physical activity. ?What are the causes? ?The exact cause of this condition is not known. Tension headaches are often triggered by stress, anxiety, or depression. Other triggers may include: ?Alcohol. ?Too much caffeine or caffeine withdrawal. ?Respiratory infections, such as colds, flu, or sinus infections. ?Dental problems or teeth clenching. ?Fatigue. ?Holding your head and neck in the same position for a long period of time, such as while using a computer. ?Smoking. ?Arthritis of the neck. ?What are the signs or symptoms? ?Symptoms of this condition include: ?A feeling of pressure or tightness around the head. ?Dull, aching head pain. ?Pain over the front and sides of the head. ?Tenderness in the muscles of the head, neck, and shoulders. ?How is this diagnosed? ?This condition may be diagnosed based on your symptoms, your medical history, and a physical exam. ?If your symptoms are severe or unusual, you may have imaging tests, such as a CT scan or an MRI of your head. Your vision may also be checked. ?How is this treated? ?This condition may be treated with lifestyle changes and with medicines that help relieve symptoms. ?Follow these instructions at  home: ?Managing pain ?Take over-the-counter and prescription medicines only as told by your health care provider. ?When you have a headache, lie down in a dark, quiet room. ?If directed, put ice on your head and neck. To do this: ?Put ice in a plastic bag. ?Place a towel between your skin and the bag. ?Leave the ice on for 20 minutes, 2-3 times a day. ?Remove the ice if your skin turns bright red. This is very important. If you cannot feel pain, heat, or cold, you have a greater risk of damage to the area. ?If directed, apply heat to the back of your neck as often as told by your health care provider. Use the heat source that your health care provider recommends, such as a moist heat pack or a heating pad. ?Place a towel between your skin and the heat source. ?Leave the heat on for 20-30 minutes. ?Remove the heat if your skin turns bright red. This is especially important if you are unable to feel pain, heat, or cold. You have a greater risk of getting burned. ?Eating and drinking ?Eat meals on a regular schedule. ?If you drink alcohol: ?Limit how much you have to: ?0-1 drink a day for women who are not pregnant. ?0-2 drinks a day for men. ?Know how much alcohol is in your drink. In the [place] one drink equals one 12 oz bottle of beer (355 mL), one 5 oz glass of wine (148 mL), or one 1? oz glass of hard liquor (44 mL). ?Drink enough fluid to keep your urine pale yellow. ?Decrease your caffeine intake, or stop  using caffeine. ?Lifestyle ?Get 7-9 hours of sleep each night, or get the amount of sleep recommended by your health care provider. ?At bedtime, remove computers, phones, and tablets from your room. ?Find ways to manage your stress. This may include: ?Exercise. ?Deep breathing exercises. ?Yoga. ?Listening to music. ?Positive mental imagery. ?Try to sit up straight and avoid tensing your muscles. ?Do not use any products that contain nicotine or tobacco. These include cigarettes, chewing tobacco, and vaping  devices, such as e-cigarettes. If you need help quitting, ask your health care provider. ?General instructions ? ?Avoid any headache triggers. Keep a journal to help find out what may trigger your headaches. For example, write down: ?What you eat and drink. ?How much sleep you get. ?Any change to your diet or medicines. ?Keep all follow-up visits. This is important. ?Contact a health care provider if: ?Your headache does not get better. ?Your headache comes back. ?You are sensitive to sounds, light, or smells because of a headache. ?You have nausea or you vomit. ?Your stomach hurts. ?Get help right away if: ?You suddenly develop a severe headache, along with any of the following: ?A stiff neck. ?Nausea and vomiting. ?Confusion. ?Weakness in one part or one side of your body. ?Double vision or loss of vision. ?Shortness of breath. ?Rash. ?Unusual sleepiness. ?Fever or chills. ?Trouble speaking. ?Pain in your eye or ear. ?Trouble walking or balancing. ?Feeling faint or passing out. ?Summary ?A tension headache is a feeling of pain, pressure, or aching over the front and sides of the head. ?A tension headache can last from 30 minutes to several days. It is the most common kind of headache. ?This condition may be diagnosed based on your symptoms, your medical history, and a physical exam. ?This condition may be treated with lifestyle changes and with medicines that help relieve symptoms. ?This information is not intended to replace advice given to you by your health care provider. Make sure you discuss any questions you have with your health care provider. ?Document Revised: 03/01/2020 Document Reviewed: 03/01/2020 ?Elsevier Patient Education ? [place]. ? ?

## 2021-08-27 NOTE — Assessment & Plan Note (Signed)
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 Associated with itching Minimal improvement with topical corticosteroid.  Entered referral to dermatology
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
## 2021-08-27 NOTE — Assessment & Plan Note (Addendum)
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 Associated with itching ?Minimal improvement with topical corticosteroid. ? ?Entered referral to dermatology ?
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
## 2021-09-24 ENCOUNTER — Ambulatory Visit (INDEPENDENT_AMBULATORY_CARE_PROVIDER_SITE_OTHER): Payer: BC Managed Care – PPO | Admitting: Psychiatry

## 2021-09-24 ENCOUNTER — Encounter: Payer: Self-pay | Admitting: Psychiatry

## 2021-09-24 VITALS — BP 132/82 | HR 67 | Ht 75.0 in | Wt 229.0 lb

## 2021-09-24 DIAGNOSIS — M5481 Occipital neuralgia: Secondary | ICD-10-CM

## 2021-09-24 DIAGNOSIS — R519 Headache, unspecified: Secondary | ICD-10-CM | POA: Diagnosis not present

## 2021-09-24 DIAGNOSIS — M542 Cervicalgia: Secondary | ICD-10-CM

## 2021-09-24 MED ORDER — TIZANIDINE HCL 4 MG PO TABS: 4.0000 mg | ORAL_TABLET | Freq: Three times a day (TID) | ORAL | 3 refills | Status: DC | PRN

## 2021-09-24 MED ORDER — AMITRIPTYLINE HCL 25 MG PO TABS: ORAL_TABLET | ORAL | 3 refills | Status: DC

## 2021-09-24 NOTE — Patient Instructions (Signed)
Start tizanidine as needed for muscle spasms and headaches. Can take up to three times a day. Side effects include drowsiness ?Start amitriptyline at bedtime for headache prevention. Take 1/2 pill at bedtime for one week, then increase to 1 pill at bedtime. ?MRI brain ?Referral to physical therapy for the neck ?

## 2021-09-25 ENCOUNTER — Telehealth: Payer: BC Managed Care – PPO | Admitting: Nurse Practitioner

## 2021-09-30 ENCOUNTER — Telehealth: Payer: Self-pay | Admitting: Psychiatry

## 2021-09-30 NOTE — Telephone Encounter (Signed)
LVM for pt to call to schedule  ?[employer] Glory Rosebush Josem Kaufmann: [place] ref # [id number] ?

## 2021-11-07 NOTE — Telephone Encounter (Signed)
45 mins MR brain w/wo contrast Dr. Beckey Rutter Gail ref: [id number] scheduled at [facility] 11/19/21 at 11am

## 2021-11-19 ENCOUNTER — Ambulatory Visit: Payer: BC Managed Care – PPO

## 2021-11-19 DIAGNOSIS — R519 Headache, unspecified: Secondary | ICD-10-CM

## 2021-11-19 MED ORDER — GADOBENATE DIMEGLUMINE 529 MG/ML IV SOLN
20.0000 mL | Freq: Once | INTRAVENOUS | Status: AC | PRN
  Administered 2021-11-19: 20 mL via INTRAVENOUS

## 2021-12-25 ENCOUNTER — Telehealth: Payer: Self-pay | Admitting: Nurse Practitioner

## 2021-12-25 NOTE — Telephone Encounter (Signed)
Pt is wanting a referral for a dermatologist. He said that he has discussed this concern with you before. Please advise pt at [phone]

## 2021-12-27 NOTE — Telephone Encounter (Signed)
Pt advised.

## 2022-01-17 ENCOUNTER — Telehealth: Payer: Self-pay | Admitting: Nurse Practitioner

## 2022-01-17 NOTE — Telephone Encounter (Signed)
Pt's wife Ronald Jackson) is needing husband's referral for Skin Lesion of lower extremity from 08/27/21 to moved to a different office. [facility] is closing as of 02/13/2022 and they have cancelled his upcoming appointment in September.   Could you ask the new provider to call his wife to schedule this appointment, he is not allowed to have his phone out at work. Christ @ [phone]

## 2022-01-21 NOTE — Telephone Encounter (Signed)
Pt was move. LTR sent to [facility] on further instructions

## 2022-01-29 ENCOUNTER — Encounter: Payer: Self-pay | Admitting: Family Medicine

## 2022-01-29 ENCOUNTER — Ambulatory Visit (INDEPENDENT_AMBULATORY_CARE_PROVIDER_SITE_OTHER): Payer: BC Managed Care – PPO | Admitting: Family Medicine

## 2022-01-29 VITALS — BP 104/67 | HR 70 | Ht 75.0 in | Wt 214.2 lb

## 2022-01-29 DIAGNOSIS — L84 Corns and callosities: Secondary | ICD-10-CM | POA: Diagnosis not present

## 2022-01-29 NOTE — Patient Instructions (Addendum)
   Recommend starting with over-the-counter corn removal therapy. Monitor for any signs of infection - redness, warmth, drainage, worsening pain/swelling. Review attached information sheet as well.   Please contact office for follow-up if symptoms do not improve or worsen. Seek emergency care if symptoms become severe.

## 2022-01-29 NOTE — Progress Notes (Signed)
   Acute Office Visit  Subjective:     Patient ID: Ronald Jackson, male    DOB: January 27, 1981, 41 y.o.   MRN: [id number]  CC: sore on finger    HPI Patient is in today for lesion to right 5th finger.   Patient states that yesterday he noticed some callousing/soreness to left 5th finger palmar surface. States he always has a callouses on his hands, but this one seemed to come up faster and is more bothersome than others have been. It is making his finger look mildly swollen. He doesn't remember noticing anything significant prior to yesterday. He has not had any trauma, cuts, etc. He denies any spreading redness, warmth, severe pain, drainage, etc. He has normal range of motion, other than slightly decreased flexion at PIP due to location of the callous/lesion.     ROS All review of systems negative except what is listed in the HPI      Objective:    BP 104/67   Pulse 70   Ht '6\' 3"'$  (1.905 m)   Wt 214 lb 3.2 oz (97.2 kg)   BMI 26.77 kg/m    Physical Exam Vitals reviewed.  Constitutional:      Appearance: Normal appearance.  Skin:    General: Skin is warm and dry.     Comments: Right 5th finger with palmar side corn near PIP, dry, hard skin, no erythema, warmth, drainage, fluctuance  Neurological:     General: No focal deficit present.     Mental Status: He is alert and oriented to person, place, and time. Mental status is at baseline.  Psychiatric:        Mood and Affect: Mood normal.        Behavior: Behavior normal.        Thought Content: Thought content normal.        Judgment: Judgment normal.        No results found for any visits on 01/29/22.      Assessment & Plan:   1. Corn or callus Recommend starting with over-the-counter corn removal therapy (pictures of examples added to AVS). Monitor for any signs of infection - redness, warmth, drainage, worsening pain/swelling. Review attached information sheet as well.   Please contact office for follow-up if  symptoms do not improve or worsen. Seek emergency care if symptoms become severe.     Return if symptoms worsen or fail to improve.  Terrilyn Saver, NP

## 2022-02-03 ENCOUNTER — Telehealth: Payer: Self-pay | Admitting: Psychiatry

## 2022-02-03 NOTE — Telephone Encounter (Signed)
VM box full, sent mychart msg informing pt of r/s needed for 9/12 appt- MD out.

## 2022-02-25 ENCOUNTER — Ambulatory Visit: Payer: BC Managed Care – PPO | Admitting: Psychiatry

## 2022-03-26 ENCOUNTER — Other Ambulatory Visit (HOSPITAL_COMMUNITY): Payer: Self-pay

## 2022-03-26 MED ORDER — CLOBETASOL PROPIONATE 0.05 % EX CREA
TOPICAL_CREAM | CUTANEOUS | 1 refills | Status: DC
  Filled 2022-03-26: qty 60, 28d supply, fill #0
  Filled 2022-03-27: qty 30, 13d supply, fill #0
  Filled 2022-03-27: qty 30, 15d supply, fill #0
  Filled 2022-06-03: qty 60, 28d supply, fill #1

## 2022-03-27 ENCOUNTER — Other Ambulatory Visit (HOSPITAL_COMMUNITY): Payer: Self-pay

## 2022-05-19 ENCOUNTER — Other Ambulatory Visit (HOSPITAL_COMMUNITY): Payer: Self-pay

## 2022-05-19 MED ORDER — CALCIPOTRIENE 0.005 % EX CREA
TOPICAL_CREAM | CUTANEOUS | 4 refills | Status: DC
  Filled 2022-05-19 – 2022-06-03 (×2): qty 60, 30d supply, fill #0
  Filled 2023-01-28 – 2023-04-10 (×3): qty 60, 30d supply, fill #1

## 2022-05-20 ENCOUNTER — Other Ambulatory Visit (HOSPITAL_COMMUNITY): Payer: Self-pay

## 2022-05-21 ENCOUNTER — Other Ambulatory Visit (HOSPITAL_COMMUNITY): Payer: Self-pay

## 2022-05-31 ENCOUNTER — Other Ambulatory Visit (HOSPITAL_COMMUNITY): Payer: Self-pay

## 2022-06-03 ENCOUNTER — Other Ambulatory Visit (HOSPITAL_COMMUNITY): Payer: Self-pay

## 2022-11-04 ENCOUNTER — Telehealth: Payer: Self-pay | Admitting: Nurse Practitioner

## 2022-11-04 NOTE — Telephone Encounter (Signed)
Pt's wife, Neysa Bonito called in to schedule her husband an appt. He is coming in on 11/07/22 Friday for Calluses on hand. Then she started telling me how in the past several days he has had a rapid heart rate and will get dizzy and sweaty. He is at work so I could not transfer him over to nurse triage. I let her know he should go to ED if this happens again.

## 2022-11-07 ENCOUNTER — Encounter: Payer: Self-pay | Admitting: Nurse Practitioner

## 2022-11-07 ENCOUNTER — Ambulatory Visit (INDEPENDENT_AMBULATORY_CARE_PROVIDER_SITE_OTHER): Payer: BC Managed Care – PPO | Admitting: Nurse Practitioner

## 2022-11-07 VITALS — BP 110/70 | HR 64 | Temp 98.1°F | Resp 16 | Ht 75.0 in | Wt 218.6 lb

## 2022-11-07 DIAGNOSIS — L852 Keratosis punctata (palmaris et plantaris): Secondary | ICD-10-CM | POA: Diagnosis not present

## 2022-11-07 NOTE — Patient Instructions (Signed)
Wear protective gloves when working You will be contacted to schedule appt with dermatology

## 2022-11-07 NOTE — Progress Notes (Unsigned)
                Established Patient Visit  Patient: Ronald Jackson   DOB: [birth year]   42 y.o. Male  MRN: [id number] Visit Date: 11/11/2022  Subjective:    Chief Complaint  Patient presents with   Callouses    Pt states" he has callouses on both hands. The one on the 4th left finger is painful   HPI Punctate keratosis Chronic, present for several years but associated with intermittent pain. Pitted keratotic lesions on bilateral palm along creases. Lesion on left 4th finger (along DIP joint-palmer surface) has become sore.  Unsure about cause of lesions Entered dermatology referral  Reviewed medical, surgical, and social history today  Medications: Outpatient Medications Prior to Visit  Medication Sig   amitriptyline (ELAVIL) 25 MG tablet Take 1/2 pill at bedtime for one week, then increase to 1 pill at bedtime   aspirin-acetaminophen-caffeine (EXCEDRIN MIGRAINE) 250-250-65 MG tablet Take 1 tablet by mouth every 6 (six) hours as needed for headache. Take with food   calcipotriene (DOVONOX) 0.005 % cream Apply to affected areas on lower legs. Do not mix with Clobetasol   clobetasol cream (TEMOVATE) 0.05 % Apply twice daily to legs for 4 weeks   fluocinolone (SYNALAR) 0.025 % ointment Apply topically 2 (two) times daily.   methocarbamol (ROBAXIN) 500 MG tablet Take 1 tablet by mouth 2 (two) times daily.   naproxen (NAPROSYN) 375 MG tablet Take 1 tablet twice daily as needed for chest pain.   NEOMYCIN-POLYMYXIN-HYDROCORTISONE (CORTISPORIN) 1 % SOLN OTIC solution Apply to nail beds from procedure site twice daily after soaks   omeprazole (PRILOSEC) 20 MG capsule Take 1 capsule (20 mg total) by mouth 2 (two) times daily before a meal.   tiZANidine (ZANAFLEX) 4 MG tablet Take 1 tablet (4 mg total) by mouth every 8 (eight) hours as needed for muscle spasms.   No facility-administered medications prior to visit.   Reviewed past medical and social history.   ROS per HPI above       Objective:  BP 110/70 (BP Location: Left Arm, Patient Position: Sitting, Cuff Size: Large)   Pulse 64   Temp 98.1 F (36.7 C) (Temporal)   Resp 16   Ht 6\' 3"  (1.905 m)   Wt 218 lb 9.6 oz (99.2 kg)   SpO2 98%   BMI 27.32 kg/m      Physical Exam Vitals and nursing note reviewed.  Musculoskeletal:       Hands:     Comments: No sign of cellulitis or joint deformity  Skin:    Findings: Rash present. Rash is scaling.     No results found for any visits on 11/07/22.    Assessment & Plan:    Problem List Items Addressed This Visit       Musculoskeletal and Integument   Punctate keratosis - Primary    Chronic, present for several years but associated with intermittent pain. Pitted keratotic lesions on bilateral palm along creases. Lesion on left 4th finger (along DIP joint-palmer surface) has become sore.  Unsure about cause of lesions Entered dermatology referral      Relevant Orders   Ambulatory referral to Dermatology   Return in about 4 weeks (around 12/05/2022) for CPE (fasting).     Alysia Penna, NP

## 2022-11-07 NOTE — Assessment & Plan Note (Signed)
Chronic, present for several years but associated with intermittent pain. Pitted keratotic lesions on bilateral palm along creases. Lesion on left 4th finger (along DIP joint-palmer surface) has become sore. Normal finger nails, no joint deformity  Unsure about cause of lesions Entered dermatology referral

## 2022-12-05 ENCOUNTER — Telehealth: Payer: Self-pay | Admitting: Nurse Practitioner

## 2022-12-05 ENCOUNTER — Encounter: Payer: BC Managed Care – PPO | Admitting: Nurse Practitioner

## 2022-12-05 NOTE — Telephone Encounter (Signed)
Pt was a no show for a cpe with Claris Gower on 12/05/22, I sent a letter.

## 2022-12-05 NOTE — Telephone Encounter (Signed)
1st no show since 2022, fee waived, letter and text sent  

## 2023-01-26 ENCOUNTER — Encounter: Payer: Self-pay | Admitting: Nurse Practitioner

## 2023-01-27 ENCOUNTER — Encounter: Payer: Self-pay | Admitting: Nurse Practitioner

## 2023-01-27 ENCOUNTER — Telehealth (INDEPENDENT_AMBULATORY_CARE_PROVIDER_SITE_OTHER): Payer: BC Managed Care – PPO | Admitting: Nurse Practitioner

## 2023-01-27 ENCOUNTER — Other Ambulatory Visit (HOSPITAL_COMMUNITY): Payer: Self-pay

## 2023-01-27 VITALS — Ht 75.0 in | Wt 212.0 lb

## 2023-01-27 DIAGNOSIS — U071 COVID-19: Secondary | ICD-10-CM

## 2023-01-27 MED ORDER — ALBUTEROL SULFATE HFA 108 (90 BASE) MCG/ACT IN AERS
2.0000 | INHALATION_SPRAY | Freq: Four times a day (QID) | RESPIRATORY_TRACT | 0 refills | Status: DC | PRN
  Filled 2023-01-27: qty 6.7, 25d supply, fill #0

## 2023-01-27 NOTE — Assessment & Plan Note (Addendum)
Encourage fluids, rest. Can alternate tylenol and ibuprofen as needed for fever and body aches. He has not had kidney function test since 2022, however would like to start paxlovid. He is scheduled for a lab visit for a BMP and will send in paxlovid if normal. Can take OTC mucinex and cough syrup as needed. Albuterol inhaler sent to pharmacy to use as needed for shortness of breath. Work note given.   Reviewed home care instructions for COVID. Advised self-isolation at home for at least 5 days. After 5 days, if improved and fever resolved, can be in public, but should wear a mask around others for an additional 5 days. If symptoms, esp, dyspnea develops/worsens, recommend in-person evaluation at either an urgent care or the emergency room.

## 2023-01-27 NOTE — Patient Instructions (Signed)
It was great to see you!  Continue alternating tylenol and ibuprofen as needed for fever and body aches  I have sent a work note to [employer] and will also print one out.   Isolate from others for 5 days and then wear a mask if needed to go out for another 5 days.   You have a lab visit for tomorrow at 11am, please call our office when you get here. I will send in paxlovid to take twice a day once we get your lab results  Start albuterol inhaler every 4 hours as needed for shortness of breath  Let's follow-up if your symptoms worsen or any concerns.   Take care,  Rodman Pickle, NP

## 2023-01-27 NOTE — Progress Notes (Signed)
[facility] [address] [place] [address] Dept: [phone] Dept Fax: [phone]  Virtual Video Visit  I connected with Brayton Mars on 01/27/23 at 11:00 AM EDT by a video enabled telemedicine application and verified that I am speaking with the correct person using two identifiers.  Location patient: Home Location provider: Clinic Persons participating in the virtual visit: Patient; Rodman Pickle, NP; Malena Peer, CMA  I discussed the limitations of evaluation and management by telemedicine and the availability of in person appointments. The patient expressed understanding and agreed to proceed.  Chief Complaint  Patient presents with   Covid Positive    Cough, runny nose, body chills and aches    SUBJECTIVE:  HPI: Ronald Jackson is a 42 y.o. male who presents with cough, body aches, chills that started last night. Covid test came back positive.  UPPER RESPIRATORY TRACT INFECTION  Fever: yes Cough: yes Shortness of breath: yes Wheezing: no Chest pain: no Chest tightness: yes Chest congestion: no Nasal congestion: yes Runny nose: no Post nasal drip: no Sneezing: yes Sore throat: no Swollen glands: no Sinus pressure: no Headache: yes Face pain: no Toothache: no Ear pain: no bilateral Ear pressure: yes bilateral Eyes red/itching:no Eye drainage/crusting: no  Vomiting: no Rash: no Fatigue: yes Sick contacts: no Strep contacts: no  Context: stable Recurrent sinusitis: no Relief with OTC cold/cough medications: yes  Treatments attempted: tylenol and ibuprofen    Patient Active Problem List   Diagnosis Date Noted   COVID-19 01/27/2023   Punctate keratosis 11/07/2022   Tension headache 08/27/2021   Dyspepsia 04/24/2021   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   Rupture of right rotator cuff 11/08/2020   GAD (generalized anxiety disorder) 11/08/2020   PTSD  (post-traumatic stress disorder) 11/08/2020   Tobacco use 08/10/2018   Skin lesion of lower extremity 08/10/2018   Right leg pain 01/02/2014    Past Surgical History:  Procedure Laterality Date   DENTAL SURGERY     FOOT SURGERY      Family History  Problem Relation Age of Onset   Diabetes Mother    Drug abuse Father    Early death Father    Diabetes Brother    Cancer Paternal Uncle        prostate cancer   Heart attack Maternal Grandmother    Alcohol abuse Maternal Grandfather    Alcohol abuse Paternal Grandfather     Social History   Tobacco Use   Smoking status: Every Day    Current packs/day: 0.50    Average packs/day: 0.5 packs/day for 25.0 years (12.5 ttl pk-yrs)    Types: Cigarettes   Smokeless tobacco: Never   Tobacco comments:    Menthols  Vaping Use   Vaping status: Never Used  Substance Use Topics   Alcohol use: Yes    Comment: socially   Drug use: Yes    Frequency: 14.0 times per week    Types: Marijuana     Current Outpatient Medications:    albuterol (VENTOLIN HFA) 108 (90 Base) MCG/ACT inhaler, Inhale 2 puffs into the lungs every 6 (six) hours as needed for wheezing or shortness of breath., Disp: 8 g, Rfl: 0   amitriptyline (ELAVIL) 25 MG tablet, Take 1/2 pill at bedtime for one week, then increase to 1 pill at bedtime, Disp: 30 tablet, Rfl: 3   aspirin-acetaminophen-caffeine (EXCEDRIN MIGRAINE) 250-250-65 MG tablet, Take 1 tablet by mouth every 6 (six) hours as needed  for headache. Take with food, Disp: 30 tablet, Rfl: 0   calcipotriene (DOVONOX) 0.005 % cream, Apply to affected areas on lower legs. Do not mix with Clobetasol, Disp: 60 g, Rfl: 4   clobetasol cream (TEMOVATE) 0.05 %, Apply twice daily to legs for 4 weeks, Disp: 60 g, Rfl: 1   fluocinolone (SYNALAR) 0.025 % ointment, Apply topically 2 (two) times daily., Disp: 120 g, Rfl: 0   methocarbamol (ROBAXIN) 500 MG tablet, Take 1 tablet by mouth 2 (two) times daily., Disp: , Rfl:    naproxen  (NAPROSYN) 375 MG tablet, Take 1 tablet twice daily as needed for chest pain., Disp: 20 tablet, Rfl: 0   NEOMYCIN-POLYMYXIN-HYDROCORTISONE (CORTISPORIN) 1 % SOLN OTIC solution, Apply to nail beds from procedure site twice daily after soaks, Disp: 10 mL, Rfl: 0   omeprazole (PRILOSEC) 20 MG capsule, Take 1 capsule (20 mg total) by mouth 2 (two) times daily before a meal., Disp: 30 capsule, Rfl: 0   tiZANidine (ZANAFLEX) 4 MG tablet, Take 1 tablet (4 mg total) by mouth every 8 (eight) hours as needed for muscle spasms., Disp: 30 tablet, Rfl: 3  No Known Allergies  ROS: See pertinent positives and negatives per HPI.  OBSERVATIONS/OBJECTIVE:  VITALS per patient if applicable: Today's Vitals   01/27/23 1104  Weight: 212 lb (96.2 kg)  Height: 6\' 3"  (1.905 m)   Body mass index is 26.5 kg/m.    GENERAL: Alert and oriented. Appears well and in no acute distress.  HEENT: Atraumatic. Conjunctiva clear. No obvious abnormalities on inspection of external nose and ears.  NECK: Normal movements of the head and neck.  LUNGS: On inspection, no signs of respiratory distress. Breathing rate appears normal. No obvious gross SOB, gasping or wheezing, and no conversational dyspnea.  CV: No obvious cyanosis.  MS: Moves all visible extremities without noticeable abnormality.  PSYCH/NEURO: Pleasant and cooperative. No obvious depression or anxiety. Speech and thought processing grossly intact.  ASSESSMENT AND PLAN:  Problem List Items Addressed This Visit       Other   COVID-19 - Primary    Encourage fluids, rest. Can alternate tylenol and ibuprofen as needed for fever and body aches. He has not had kidney function test since 2022, however would like to start paxlovid. He is scheduled for a lab visit for a BMP and will send in paxlovid if normal. Can take OTC mucinex and cough syrup as needed. Albuterol inhaler sent to pharmacy to use as needed for shortness of breath. Work note given.   Reviewed  home care instructions for COVID. Advised self-isolation at home for at least 5 days. After 5 days, if improved and fever resolved, can be in public, but should wear a mask around others for an additional 5 days. If symptoms, esp, dyspnea develops/worsens, recommend in-person evaluation at either an urgent care or the emergency room.       Relevant Orders   Basic Metabolic Panel (BMET)     I discussed the assessment and treatment plan with the patient. The patient was provided an opportunity to ask questions and all were answered. The patient agreed with the plan and demonstrated an understanding of the instructions.   The patient was advised to call back or seek an in-person evaluation if the symptoms worsen or if the condition fails to improve as anticipated.   Gerre Scull, NP

## 2023-01-28 ENCOUNTER — Other Ambulatory Visit (HOSPITAL_COMMUNITY): Payer: Self-pay

## 2023-01-28 ENCOUNTER — Other Ambulatory Visit: Payer: Self-pay

## 2023-01-28 ENCOUNTER — Other Ambulatory Visit (INDEPENDENT_AMBULATORY_CARE_PROVIDER_SITE_OTHER): Payer: BC Managed Care – PPO

## 2023-01-28 ENCOUNTER — Telehealth: Payer: Self-pay | Admitting: Nurse Practitioner

## 2023-01-28 ENCOUNTER — Encounter: Payer: Self-pay | Admitting: Nurse Practitioner

## 2023-01-28 DIAGNOSIS — U071 COVID-19: Secondary | ICD-10-CM | POA: Diagnosis not present

## 2023-01-28 LAB — BASIC METABOLIC PANEL
BUN: 9 mg/dL (ref 6–23)
CO2: 27 mEq/L (ref 19–32)
Calcium: 9.4 mg/dL (ref 8.4–10.5)
Chloride: 105 mEq/L (ref 96–112)
Creatinine, Ser: 1.09 mg/dL (ref 0.40–1.50)
GFR: 83.7 mL/min (ref >60.00)
Glucose, Bld: 119 mg/dL — ABNORMAL HIGH (ref 70–99)
Potassium: 3.7 mEq/L (ref 3.5–5.1)
Sodium: 138 mEq/L (ref 135–145)

## 2023-01-28 MED ORDER — NIRMATRELVIR/RITONAVIR (PAXLOVID)TABLET: 3.0000 | ORAL_TABLET | Freq: Two times a day (BID) | ORAL | 0 refills | Status: DC

## 2023-01-28 NOTE — Telephone Encounter (Signed)
Pt called to ask about the med that was discussed for covid. He stated that the pharmacy has not notified him of one.

## 2023-01-29 ENCOUNTER — Other Ambulatory Visit: Payer: Self-pay

## 2023-01-29 ENCOUNTER — Other Ambulatory Visit (HOSPITAL_COMMUNITY): Payer: Self-pay

## 2023-01-29 MED ORDER — NIRMATRELVIR/RITONAVIR (PAXLOVID)TABLET
3.0000 | ORAL_TABLET | Freq: Two times a day (BID) | ORAL | 0 refills | Status: AC
  Filled 2023-01-29: qty 30, 5d supply, fill #0

## 2023-01-29 MED ORDER — NIRMATRELVIR/RITONAVIR (PAXLOVID)TABLET
3.0000 | ORAL_TABLET | Freq: Two times a day (BID) | ORAL | 0 refills | Status: DC
  Filled 2023-01-29: qty 30, 5d supply, fill #0

## 2023-01-29 NOTE — Telephone Encounter (Signed)
I called and spoke with patient and notified him of below message.

## 2023-01-30 ENCOUNTER — Other Ambulatory Visit (HOSPITAL_COMMUNITY): Payer: Self-pay

## 2023-02-07 ENCOUNTER — Other Ambulatory Visit (HOSPITAL_COMMUNITY): Payer: Self-pay

## 2023-02-10 ENCOUNTER — Other Ambulatory Visit (HOSPITAL_COMMUNITY): Payer: Self-pay

## 2023-02-19 ENCOUNTER — Encounter: Payer: Self-pay | Admitting: Nurse Practitioner

## 2023-02-20 ENCOUNTER — Other Ambulatory Visit (HOSPITAL_COMMUNITY): Payer: Self-pay

## 2023-02-21 ENCOUNTER — Other Ambulatory Visit (HOSPITAL_COMMUNITY): Payer: Self-pay

## 2023-02-28 ENCOUNTER — Other Ambulatory Visit (HOSPITAL_COMMUNITY): Payer: Self-pay

## 2023-03-07 ENCOUNTER — Other Ambulatory Visit (HOSPITAL_COMMUNITY): Payer: Self-pay

## 2023-04-01 ENCOUNTER — Telehealth: Payer: Self-pay | Admitting: Nurse Practitioner

## 2023-04-01 NOTE — Telephone Encounter (Signed)
Please advise 

## 2023-04-01 NOTE — Telephone Encounter (Signed)
Pt was prescribed by another provider but is asking for a refill from you. I suggested he make an appt but he wanted me to ask.  Rx #: [id number]  clobetasol cream (TEMOVATE) 0.05 % [[id number]]     - [facility] [address], [place] [address] Phone: [phone]  Fax: [phone]

## 2023-04-10 ENCOUNTER — Other Ambulatory Visit (HOSPITAL_COMMUNITY): Payer: Self-pay

## 2023-04-10 ENCOUNTER — Other Ambulatory Visit: Payer: Self-pay | Admitting: Nurse Practitioner

## 2023-04-23 ENCOUNTER — Other Ambulatory Visit (HOSPITAL_COMMUNITY): Payer: Self-pay

## 2023-05-27 ENCOUNTER — Other Ambulatory Visit (HOSPITAL_COMMUNITY): Payer: Self-pay

## 2023-05-28 ENCOUNTER — Telehealth: Payer: BC Managed Care – PPO | Admitting: Nurse Practitioner

## 2023-05-28 ENCOUNTER — Other Ambulatory Visit (HOSPITAL_COMMUNITY): Payer: Self-pay

## 2023-05-28 ENCOUNTER — Encounter: Payer: Self-pay | Admitting: Nurse Practitioner

## 2023-05-28 DIAGNOSIS — L852 Keratosis punctata (palmaris et plantaris): Secondary | ICD-10-CM

## 2023-05-28 MED ORDER — CLOBETASOL PROPIONATE 0.05 % EX CREA
TOPICAL_CREAM | CUTANEOUS | 1 refills | Status: AC
  Filled 2023-05-28: qty 120, 90d supply, fill #0
  Filled 2023-08-28: qty 120, 90d supply, fill #1

## 2023-05-28 NOTE — Assessment & Plan Note (Signed)
Persistent hyper keratotic lesions on bilateral shin. Has intermittent itching. This improves with use of clobetasol cream He needs med refill and referral to new dermatology  Med refill sent

## 2023-05-28 NOTE — Progress Notes (Signed)
Virtual Visit via Video Note  I connected withNAME@ on 05/28/23 at  1:20 PM EST by a video enabled telemedicine application and verified that I am speaking with the correct person using two identifiers.  Location: Patient:Home Provider: Office Participants: patient and provider  I discussed the limitations of evaluation and management by telemedicine and the availability of in person appointments. I also discussed with the patient that there may be a patient responsible charge related to this service. The patient expressed understanding and agreed to proceed.  CC:PT C/O of dry skin and itchiness on both shins for a couple of years. He was using Temovate for symptoms but was unable to obtain refills due to closure of [place] [facility]  History of Present Illness:   Observations/Objective: Physical Exam Vitals and nursing note reviewed.  Pulmonary:     Effort: Pulmonary effort is normal.  Musculoskeletal:     Right lower leg: No edema.     Left lower leg: No edema.  Skin:    Findings: Lesion present. No erythema.       Neurological:     Mental Status: He is alert.     Assessment and Plan: Paden was seen today for video visit .  Diagnoses and all orders for this visit:  Punctate keratosis -     clobetasol cream (TEMOVATE) 0.05 %; Apply twice daily to legs for 4 weeks -     Ambulatory referral to Dermatology   Follow Up Instructions: See instructions above   I discussed the assessment and treatment plan with the patient. The patient was provided an opportunity to ask questions and all were answered. The patient agreed with the plan and demonstrated an understanding of the instructions.   The patient was advised to call back or seek an in-person evaluation if the symptoms worsen or if the condition fails to improve as anticipated.  Alysia Penna, NP

## 2023-05-29 ENCOUNTER — Other Ambulatory Visit (HOSPITAL_COMMUNITY): Payer: Self-pay

## 2023-06-04 ENCOUNTER — Other Ambulatory Visit (HOSPITAL_COMMUNITY): Payer: Self-pay

## 2023-07-20 ENCOUNTER — Encounter: Payer: BC Managed Care – PPO | Admitting: Nurse Practitioner

## 2023-07-21 ENCOUNTER — Telehealth: Payer: Self-pay | Admitting: Nurse Practitioner

## 2023-07-21 NOTE — Telephone Encounter (Signed)
12/05/2022 no show 07/20/2023 no show  Final warning sent via mail and mychart

## 2023-08-02 ENCOUNTER — Ambulatory Visit (INDEPENDENT_AMBULATORY_CARE_PROVIDER_SITE_OTHER): Payer: BC Managed Care – PPO

## 2023-08-02 ENCOUNTER — Ambulatory Visit
Admission: EM | Admit: 2023-08-02 | Discharge: 2023-08-02 | Disposition: A | Discharge status: Home / Self Care | Payer: BC Managed Care – PPO | Attending: Physician Assistant | Admitting: Physician Assistant

## 2023-08-02 DIAGNOSIS — S6991XA Unspecified injury of right wrist, hand and finger(s), initial encounter: Secondary | ICD-10-CM

## 2023-08-02 NOTE — ED Triage Notes (Signed)
"  I was playing around with my wife and smashed my finger into the door". "It ripped fingernail off, DOI: [id number]".

## 2023-08-16 ENCOUNTER — Encounter: Payer: Self-pay | Admitting: Physician Assistant

## 2023-08-16 NOTE — ED Provider Notes (Signed)
 [name]    [name]: [id number] Arrival date & time: 08/02/23  1046      History   Chief Complaint Chief Complaint  Patient presents with   Finger Injury    HPI Ronald Jackson is a 43 y.o. male.   Patient here today for evaluation of finger injury that occurred 2 days ago.  He reports that he was playing around with his wife and accidentally got his right index finger smashed in the door.  He notes his fingernail seem to be ripped off at that time.  He denies any numbness.  He did have bleeding but this has been controlled.  He has had significant swelling  The history is provided by the patient.    Past Medical History:  Diagnosis Date   Migraine     Patient Active Problem List   Diagnosis Date Noted   COVID-19 01/27/2023   Tension headache 08/27/2021   Dyspepsia 04/24/2021   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   Rupture of right rotator cuff 11/08/2020   GAD (generalized anxiety disorder) 11/08/2020   PTSD (post-traumatic stress disorder) 11/08/2020   Tobacco use 08/10/2018   Punctate keratosis 08/10/2018   Right leg pain 01/02/2014    Past Surgical History:  Procedure Laterality Date   DENTAL SURGERY     FOOT SURGERY         Home Medications    Prior to Admission medications   Medication Sig Start Date End Date Taking? Authorizing Provider  clobetasol cream (TEMOVATE) 0.05 % Apply twice daily to legs for 4 weeks 05/28/23  Yes Nche, Bonna Gains, NP  albuterol (VENTOLIN HFA) 108 (90 Base) MCG/ACT inhaler Inhale 2 puffs into the lungs every 6 (six) hours as needed for wheezing or shortness of breath. 01/27/23   McElwee, Jake Church, NP  amitriptyline (ELAVIL) 25 MG tablet Take 1/2 pill at bedtime for one week, then increase to 1 pill at bedtime 09/24/21   Ocie Doyne, MD  aspirin-acetaminophen-caffeine (EXCEDRIN MIGRAINE) [phone] MG tablet Take 1 tablet by mouth every 6 (six) hours as needed for headache. Take with food  08/27/21   Nche, Bonna Gains, NP  methocarbamol (ROBAXIN) 500 MG tablet Take 1 tablet by mouth 2 (two) times daily. 11/29/20   [provider]  naproxen (NAPROSYN) 375 MG tablet Take 1 tablet twice daily as needed for chest pain. 11/29/20   Molpus, Jonny Ruiz, MD  omeprazole (PRILOSEC) 20 MG capsule Take 1 capsule (20 mg total) by mouth 2 (two) times daily before a meal. 04/24/21   Nche, Bonna Gains, NP  tiZANidine (ZANAFLEX) 4 MG tablet Take 1 tablet (4 mg total) by mouth every 8 (eight) hours as needed for muscle spasms. 09/24/21   Ocie Doyne, MD    Family History Family History  Problem Relation Age of Onset   Diabetes Mother    Drug abuse Father    Early death Father    Diabetes Brother    Cancer Paternal Uncle        prostate cancer   Heart attack Maternal Grandmother    Alcohol abuse Maternal Grandfather    Alcohol abuse Paternal Grandfather     Social History Social History   Tobacco Use   Smoking status: Some Days    Current packs/day: 0.50    Average packs/day: 0.5 packs/day for 25.0 years (12.5 ttl pk-yrs)    Types: Cigarettes   Smokeless tobacco: Never   Tobacco comments:    Menthols  Vaping Use  Vaping status: Never Used  Substance Use Topics   Alcohol use: Yes    Comment: socially   Drug use: Not Currently    Frequency: 14.0 times per week    Types: Marijuana     Allergies   Patient has no known allergies.   Review of Systems Review of Systems  Constitutional:  Negative for chills and fever.  Eyes:  Negative for discharge and redness.  Respiratory:  Negative for shortness of breath.   Skin:  Positive for color change and wound.  Neurological:  Negative for numbness.     Physical Exam Triage Vital Signs ED Triage Vitals  Encounter Vitals Group     BP 08/02/23 1232 111/74     Systolic BP Percentile --      Diastolic BP Percentile --      Pulse Rate 08/02/23 1232 74     Resp 08/02/23 1232 18     Temp 08/02/23 1232 98.3 F (36.8 C)      Temp Source 08/02/23 1232 Oral     SpO2 08/02/23 1232 97 %     Weight 08/02/23 1230 238 lb (108 kg)     Height 08/02/23 1230 6\' 3"  (1.905 m)     Head Circumference --      Peak Flow --      Pain Score 08/02/23 1226 5     Pain Loc --      Pain Education --      Exclude from Growth Chart --    No data found.  Updated Vital Signs BP 111/74 (BP Location: Right Arm)   Pulse 74   Temp 98.3 F (36.8 C) (Oral)   Resp 18   Ht 6\' 3"  (1.905 m)   Wt 238 lb (108 kg)   SpO2 97%   BMI 29.75 kg/m   Visual Acuity Right Eye Distance:   Left Eye Distance:   Bilateral Distance:    Right Eye Near:   Left Eye Near:    Bilateral Near:     Physical Exam Vitals and nursing note reviewed.  Constitutional:      General: He is not in acute distress.    Appearance: Normal appearance. He is not ill-appearing.  HENT:     Head: Normocephalic and atraumatic.  Eyes:     Conjunctiva/sclera: Conjunctivae normal.  Cardiovascular:     Rate and Rhythm: Normal rate.  Pulmonary:     Effort: Pulmonary effort is normal. No respiratory distress.  Musculoskeletal:     Comments: Diffuse swelling appreciated to distal right index finger.  Decreased range of motion due to pain.  Skin:    Capillary Refill: Cap refill normal to distal right index finger    Comments: Partial avulsion of right index nail with dried blood/clot  noted  Neurological:     Mental Status: He is alert.     Comments: Gross sensation intact to distal right index finger  Psychiatric:        Mood and Affect: Mood normal.        Behavior: Behavior normal.        Thought Content: Thought content normal.      UC Treatments / Results  Labs (all labs ordered are listed, but only abnormal results are displayed) Labs Reviewed - No data to display  EKG   Radiology No results found.  Procedures Procedures (including critical care time)  Medications Ordered in UC Medications - No data to display  Initial Impression /  Assessment and Plan /  UC Course  I have reviewed the triage vital signs and the nursing notes.  Pertinent labs & imaging results that were available during my care of the patient were reviewed by me and considered in my medical decision making (see chart for details).    Xray without fracture. Recommended continued monitoring and follow up with any further concerns or lack of improvement over the next few days.   Final Clinical Impressions(s) / UC Diagnoses   Final diagnoses:  Injury of right index finger, initial encounter   Discharge Instructions   None    ED Prescriptions   None    PDMP not reviewed this encounter.   Tomi Bamberger, PA-C 08/16/23 [phone]

## 2023-08-31 ENCOUNTER — Other Ambulatory Visit (HOSPITAL_COMMUNITY): Payer: Self-pay

## 2023-08-31 ENCOUNTER — Other Ambulatory Visit: Payer: Self-pay

## 2023-09-08 ENCOUNTER — Telehealth: Payer: Self-pay | Admitting: Nurse Practitioner

## 2023-09-08 NOTE — Telephone Encounter (Signed)
 Error

## 2023-09-21 ENCOUNTER — Ambulatory Visit: Admitting: Nurse Practitioner

## 2023-09-29 ENCOUNTER — Telehealth: Payer: Self-pay | Admitting: Nurse Practitioner

## 2023-09-29 NOTE — Telephone Encounter (Signed)
 09/21/2023 same day cancel via mychart 12/05/2022 no show 07/20/2023 no show  Future appt 11/06/2023 with Margarie Shay, NP at [facility] cancelled.   Dismissal sent via mail & mychart. Future visits with [facility] cancelled.

## 2023-11-05 ENCOUNTER — Emergency Department (HOSPITAL_BASED_OUTPATIENT_CLINIC_OR_DEPARTMENT_OTHER): Admitting: Radiology

## 2023-11-05 ENCOUNTER — Emergency Department (HOSPITAL_BASED_OUTPATIENT_CLINIC_OR_DEPARTMENT_OTHER)
Admission: EM | Admit: 2023-11-05 | Discharge: 2023-11-05 | Disposition: A | Discharge status: Home / Self Care | Attending: Emergency Medicine | Admitting: Emergency Medicine

## 2023-11-05 ENCOUNTER — Other Ambulatory Visit: Payer: Self-pay

## 2023-11-05 ENCOUNTER — Encounter (HOSPITAL_BASED_OUTPATIENT_CLINIC_OR_DEPARTMENT_OTHER): Payer: Self-pay

## 2023-11-05 DIAGNOSIS — F17200 Nicotine dependence, unspecified, uncomplicated: Secondary | ICD-10-CM | POA: Diagnosis not present

## 2023-11-05 DIAGNOSIS — R0789 Other chest pain: Secondary | ICD-10-CM | POA: Diagnosis present

## 2023-11-05 DIAGNOSIS — R079 Chest pain, unspecified: Secondary | ICD-10-CM

## 2023-11-05 DIAGNOSIS — I10 Essential (primary) hypertension: Secondary | ICD-10-CM | POA: Insufficient documentation

## 2023-11-05 LAB — BASIC METABOLIC PANEL WITH GFR
Anion gap: 11 (ref 5–15)
BUN: 8 mg/dL (ref 6–20)
CO2: 27 mmol/L (ref 22–32)
Calcium: 9.7 mg/dL (ref 8.9–10.3)
Chloride: 104 mmol/L (ref 98–111)
Creatinine, Ser: 1.08 mg/dL (ref 0.61–1.24)
GFR, Estimated: >60 mL/min (ref >60)
Glucose, Bld: 104 mg/dL — ABNORMAL HIGH (ref 70–99)
Potassium: 3.4 mmol/L — ABNORMAL LOW (ref 3.5–5.1)
Sodium: 142 mmol/L (ref 135–145)

## 2023-11-05 LAB — TROPONIN T, HIGH SENSITIVITY: Troponin T High Sensitivity: <15 ng/L (ref <19)

## 2023-11-05 LAB — CBC
HCT: 36.1 % — ABNORMAL LOW (ref 39.0–52.0)
Hemoglobin: 12.5 g/dL — ABNORMAL LOW (ref 13.0–17.0)
MCH: 28.9 pg (ref 26.0–34.0)
MCHC: 34.6 g/dL (ref 30.0–36.0)
MCV: 83.4 fL (ref 80.0–100.0)
Platelets: 168 10*3/uL (ref 150–400)
RBC: 4.33 MIL/uL (ref 4.22–5.81)
RDW: 15 % (ref 11.5–15.5)
WBC: 5.4 10*3/uL (ref 4.0–10.5)
nRBC: 0 % (ref 0.0–0.2)

## 2023-11-05 MED ORDER — KETOROLAC TROMETHAMINE 15 MG/ML IJ SOLN
15.0000 mg | Freq: Once | INTRAMUSCULAR | Status: AC
  Administered 2023-11-05: 15 mg via INTRAVENOUS
  Filled 2023-11-05: qty 1

## 2023-11-05 MED ORDER — CELECOXIB 200 MG PO CAPS: 200.0000 mg | ORAL_CAPSULE | Freq: Two times a day (BID) | ORAL | 0 refills | Status: AC

## 2023-11-05 MED ORDER — KETOROLAC TROMETHAMINE 15 MG/ML IJ SOLN: 15.0000 mg | Freq: Once | INTRAMUSCULAR | Status: DC

## 2023-11-05 MED ORDER — ACETAMINOPHEN 500 MG PO TABS
1000.0000 mg | ORAL_TABLET | Freq: Once | ORAL | Status: AC
  Administered 2023-11-05: 1000 mg via ORAL
  Filled 2023-11-05: qty 2

## 2023-11-05 NOTE — ED Provider Notes (Signed)
 [place] EMERGENCY DEPARTMENT AT [facility] Provider Note   [name]: [id number] Arrival date & time: 11/05/23  7829     History Chief Complaint  Patient presents with   Chest Pain    HPI Ronald Jackson is a 43 y.o. male presenting for chief complaint chest pain.  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  States he started having chest pain at work today. Denies fevers chills nausea vomiting syncope shortness of breath.  States it is worse when he lifts his arms up. He does smoke otherwise has no medical problems or family history of cardiac disease..   Patient's recorded medical, surgical, social, medication list and allergies were reviewed in the Snapshot window as part of the initial history.   Review of Systems   Review of Systems  Constitutional:  Negative for chills and fever.  HENT:  Negative for ear pain and sore throat.   Eyes:  Negative for pain and visual disturbance.  Respiratory:  Negative for cough and shortness of breath.   Cardiovascular:  Positive for chest pain. Negative for palpitations.  Gastrointestinal:  Negative for abdominal pain and vomiting.  Genitourinary:  Negative for dysuria and hematuria.  Musculoskeletal:  Negative for arthralgias and back pain.  Skin:  Negative for color change and rash.  Neurological:  Negative for seizures and syncope.  All other systems reviewed and are negative.   Physical Exam Updated Vital Signs BP 125/85 (BP Location: Right Arm)   Pulse 71   Temp (!) 97.5 F (36.4 C) (Oral)   Resp 18   SpO2 100%  Physical Exam Vitals and nursing note reviewed.  Constitutional:      General: He is not in acute distress.    Appearance: He is well-developed.  HENT:     Head: Normocephalic and atraumatic.  Eyes:     Conjunctiva/sclera: Conjunctivae normal.  Cardiovascular:     Rate and Rhythm: Normal rate and regular rhythm.     Heart sounds: No murmur heard. Pulmonary:     Effort: Pulmonary effort is normal. No  respiratory distress.     Breath sounds: Normal breath sounds.  Chest:     Chest wall: Tenderness present.  Abdominal:     Palpations: Abdomen is soft.     Tenderness: There is no abdominal tenderness.  Musculoskeletal:        General: No swelling.     Cervical back: Neck supple.  Skin:    General: Skin is warm and dry.     Capillary Refill: Capillary refill takes less than 2 seconds.  Neurological:     Mental Status: He is alert.  Psychiatric:        Mood and Affect: Mood normal.      ED Course/ Medical Decision Making/ A&P    Procedures Procedures   Medications Ordered in ED Medications  acetaminophen  (TYLENOL ) tablet 1,000 mg (1,000 mg Oral Given 11/05/23 0216)  ketorolac  (TORADOL ) 15 MG/ML injection 15 mg (15 mg Intravenous Given 11/05/23 0216)   Medical Decision Making: Ronald Jackson is a 43 y.o. male who presented to the ED today with chest pain, detailed above.  Based on patient's comorbidities, patient has a heart score of 1.    Patient placed on continuous vitals and telemetry monitoring while in ED which was reviewed periodically.  Complete initial physical exam performed, notably the patient was HDS in NAD.   Reviewed and confirmed nursing documentation for past medical history, family history, social history.  Initial Assessment: With the patient's presentation of left-sided chest pain, most likely diagnosis is musculoskeletal chest pain versus GERD, although ACS remains on the differential. Other diagnoses were considered including (but not limited to) pulmonary embolism, community-acquired pneumonia, aortic dissection, pneumothorax, underlying bony abnormality, anemia. These are considered less likely due to history of present illness and physical exam findings.    In particular, concerning pulmonary embolism: Patient is PERC NEGATIVE and the they deny malignancy, recent surgery, history of DVT, or calf tenderness leading to a low risk Wells score. Aortic  Dissection also reconsidered but seems less likely based on the location, quality, onset, and severity of symptoms in this case. Patient has a lack of serious comorbidities for this condition including a lack of HTN. Patient also has a lack of underlying history of AD or TAA.  This is most consistent with an acute life/limb threatening illness complicated by underlying chronic conditions.   Initial Plan: EKG and single troponin to evaluate for cardiac pathology. Single troponin appropriate due to greater than 6 hours since maximal intensity of symptoms. Evaluate for dissection, bony abnormality, or pneumonia with chest x-ray and screening laboratory evaluation including CBC, BMP  Further evaluation for pulmonary embolism not indicated at this time based on patient's PERC and Wells score.  Further evaluation for Thoracic Aortic Dissection not indicated at this time based on patient's clinical history and PE findings.   Initial Study Results: EKG was reviewed independently. Rate, rhythm, axis, intervals all examined and without medically relevant abnormality. ST segments without concerns for elevations.    Laboratory  Single troponin demonstratedNAA   CBC and BMP without obvious metabolic or inflammatory abnormalities requiring further evaluation   Radiology  DG Chest 2 View Result Date: 11/05/2023 CLINICAL DATA:  Chest pain EXAM: CHEST - 2 VIEW COMPARISON:  11/28/2020 FINDINGS: The heart size and mediastinal contours are within normal limits. Both lungs are clear. The visualized skeletal structures are unremarkable. IMPRESSION: No active cardiopulmonary disease. Electronically Signed   By: Violeta Grey M.D.   On: 11/05/2023 01:14    Final Assessment and Plan: On repeat clinical assessment, patient has had complete symptomatic resolution.  They are currently ambulatory tolerating p.o. intake.  They deny fevers or chills, nausea vomiting, syncope shortness of breath.  Favor likely musculoskeletal  versus gastroesophageal etiology of patient's symptoms.  Considered ACS grossly less likely given resolution, well appearance and negative troponin and low HEART score.  Patient to follow-up with primary care provider within 48 hours for ongoing care and management.             Clinical Impression:  1. Chest pain, unspecified type      Discharge   Final Clinical Impression(s) / ED Diagnoses Final diagnoses:  Chest pain, unspecified type    Rx / DC Orders ED Discharge Orders          Ordered    celecoxib (CELEBREX) 200 MG capsule  2 times daily        11/05/23 0209              Onetha Bile, MD 11/05/23 [phone]

## 2023-11-05 NOTE — ED Triage Notes (Signed)
 Pt reports he is here today due to chest pain since last night. Pt reports the pain is central and radiates to the upper back. Pt denies any PMH,Pt denies any sob.

## 2023-11-06 ENCOUNTER — Encounter: Admitting: Nurse Practitioner

## 2024-03-09 ENCOUNTER — Ambulatory Visit (INDEPENDENT_AMBULATORY_CARE_PROVIDER_SITE_OTHER): Admitting: Family Medicine

## 2024-03-09 ENCOUNTER — Encounter: Payer: Self-pay | Admitting: Family Medicine

## 2024-03-09 ENCOUNTER — Encounter: Admitting: Family Medicine

## 2024-03-09 VITALS — BP 135/83 | HR 76 | Ht 75.0 in | Wt 225.6 lb

## 2024-03-09 DIAGNOSIS — F17200 Nicotine dependence, unspecified, uncomplicated: Secondary | ICD-10-CM | POA: Diagnosis not present

## 2024-03-09 DIAGNOSIS — L852 Keratosis punctata (palmaris et plantaris): Secondary | ICD-10-CM

## 2024-03-09 DIAGNOSIS — L989 Disorder of the skin and subcutaneous tissue, unspecified: Secondary | ICD-10-CM | POA: Diagnosis not present

## 2024-03-09 DIAGNOSIS — R0683 Snoring: Secondary | ICD-10-CM

## 2024-03-09 DIAGNOSIS — Z7689 Persons encountering health services in other specified circumstances: Secondary | ICD-10-CM

## 2024-03-09 NOTE — Progress Notes (Unsigned)
estab

## 2024-03-09 NOTE — Progress Notes (Unsigned)
 New Patient Office Visit  Subjective    Patient ID: Ronald Jackson, male    DOB: May 11, 1981  Age: 43 y.o. MRN: [id number]  CC:  Chief Complaint  Patient presents with   Establish Care    HPI Ronald Jackson presents to establish care   Outpatient Encounter Medications as of 03/09/2024  Medication Sig   clobetasol  cream (TEMOVATE ) 0.05 % Apply twice daily to legs for 4 weeks   albuterol  (VENTOLIN  HFA) 108 (90 Base) MCG/ACT inhaler Inhale 2 puffs into the lungs every 6 (six) hours as needed for wheezing or shortness of breath.   amitriptyline  (ELAVIL ) 25 MG tablet Take 1/2 pill at bedtime for one week, then increase to 1 pill at bedtime   aspirin-acetaminophen -caffeine (EXCEDRIN  MIGRAINE) 250-250-65 MG tablet Take 1 tablet by mouth every 6 (six) hours as needed for headache. Take with food   celecoxib  (CELEBREX ) 200 MG capsule Take 1 capsule (200 mg total) by mouth 2 (two) times daily. (Patient not taking: Reported on 03/09/2024)   methocarbamol  (ROBAXIN ) 500 MG tablet Take 1 tablet by mouth 2 (two) times daily.   naproxen  (NAPROSYN ) 375 MG tablet Take 1 tablet twice daily as needed for chest pain.   omeprazole  (PRILOSEC) 20 MG capsule Take 1 capsule (20 mg total) by mouth 2 (two) times daily before a meal.   tiZANidine  (ZANAFLEX ) 4 MG tablet Take 1 tablet (4 mg total) by mouth every 8 (eight) hours as needed for muscle spasms.   No facility-administered encounter medications on file as of 03/09/2024.    Past Medical History:  Diagnosis Date   Migraine     Past Surgical History:  Procedure Laterality Date   DENTAL SURGERY     FOOT SURGERY      Family History  Problem Relation Age of Onset   Diabetes Mother    Drug abuse Father    Early death Father    Diabetes Brother    Cancer Paternal Uncle        prostate cancer   Heart attack Maternal Grandmother    Alcohol abuse Maternal Grandfather    Alcohol abuse Paternal Grandfather     Social History   Socioeconomic  History   Marital status: Married    Spouse name: Not on file   Number of children: 2   Years of education: Not on file   Highest education level: 12th grade  Occupational History   Not on file  Tobacco Use   Smoking status: Some Days    Current packs/day: 0.50    Average packs/day: 0.5 packs/day for 25.0 years (12.5 ttl pk-yrs)    Types: Cigarettes   Smokeless tobacco: Never   Tobacco comments:    Menthols  Vaping Use   Vaping status: Never Used  Substance and Sexual Activity   Alcohol use: Yes    Comment: socially   Drug use: Not Currently    Frequency: 14.0 times per week    Types: Marijuana   Sexual activity: Yes    Birth control/protection: None  Other Topics Concern   Not on file  Social History Narrative   Right handed   Social Drivers of Health   Financial Resource Strain: Medium Risk (03/08/2024)   Overall Financial Resource Strain (CARDIA)    Difficulty of Paying Living Expenses: Somewhat hard  Food Insecurity: No Food Insecurity (03/08/2024)   Hunger Vital Sign    Worried About Running Out of Food in the Last Year: Never true    Ran  Out of Food in the Last Year: Never true  Transportation Needs: No Transportation Needs (03/08/2024)   PRAPARE - Administrator, Civil Service (Medical): No    Lack of Transportation (Non-Medical): No  Physical Activity: Insufficiently Active (03/08/2024)   Exercise Vital Sign    Days of Exercise per Week: 4 days    Minutes of Exercise per Session: 30 min  Stress: No Stress Concern Present (03/08/2024)   [employer] of Occupational Health - Occupational Stress Questionnaire    Feeling of Stress: Only a little  Social Connections: Socially Integrated (03/08/2024)   Social Connection and Isolation Panel    Frequency of Communication with Friends and Family: More than three times a week    Frequency of Social Gatherings with Friends and Family: Twice a week    Attends Religious Services: More than 4 times per year     Active Member of [employer] or Organizations: Yes    Attends Banker Meetings: 1 to 4 times per year    Marital Status: Married  Catering manager Violence: Unknown (09/20/2021)   Received from [facility]   HITS    Physically Hurt: Not on file    Insult or Talk Down To: Not on file    Threaten Physical Harm: Not on file    Scream or Curse: Not on file    ROS      Objective   BP 135/83   Pulse 76   Ht 6' 3 (1.905 m)   Wt 225 lb 9.6 oz (102.3 kg)   SpO2 98%   BMI 28.20 kg/m   Physical Exam  {Labs (Optional):23779}    Assessment & Plan:   Encounter to establish care     No follow-ups on file.   [name] SQUIBB, MD

## 2024-04-19 ENCOUNTER — Encounter: Payer: Self-pay | Admitting: Family Medicine

## 2024-04-20 ENCOUNTER — Other Ambulatory Visit: Payer: Self-pay | Admitting: Family Medicine

## 2024-04-20 ENCOUNTER — Other Ambulatory Visit (HOSPITAL_COMMUNITY): Payer: Self-pay

## 2024-04-20 MED ORDER — ONDANSETRON 4 MG PO TBDP
4.0000 mg | ORAL_TABLET | Freq: Three times a day (TID) | ORAL | 0 refills | Status: AC | PRN
  Filled 2024-04-20 – 2024-04-26 (×2): qty 20, 7d supply, fill #0

## 2024-04-20 MED ORDER — SCOPOLAMINE 1 MG/3DAYS TD PT72
1.0000 | MEDICATED_PATCH | TRANSDERMAL | 0 refills | Status: AC
  Filled 2024-04-20 – 2024-04-26 (×2): qty 4, 12d supply, fill #0

## 2024-04-23 ENCOUNTER — Other Ambulatory Visit (HOSPITAL_COMMUNITY): Payer: Self-pay

## 2024-04-26 ENCOUNTER — Other Ambulatory Visit (HOSPITAL_COMMUNITY): Payer: Self-pay

## 2024-05-17 ENCOUNTER — Ambulatory Visit: Payer: Self-pay

## 2024-05-17 NOTE — Telephone Encounter (Signed)
 FYI Only or Action Required?: FYI only for provider: UC advised due to patient's schedule.  Patient was last seen in primary care on 03/09/2024 by Tanda Bleacher, MD.  Called Nurse Triage reporting Foot Problem.  Symptoms began several weeks ago.  Interventions attempted: Nothing.  Symptoms are: gradually worsening.  Triage Disposition: See PCP When Office is Open (Within 3 Days)  Patient/caregiver understands and will follow disposition?: Yes  Copied from CRM #[id number]. Topic: Clinical - Red Word Triage >> May 17, 2024  1:00 PM [place] MATSU wrote: Red Word that prompted transfer to Nurse Triage: right foot/ limping in pain issues with feet he ball under the skin and toe nail issues. Reason for Disposition  [1] MODERATE pain (e.g., interferes with normal activities, limping) AND [2] present > 3 days  Answer Assessment - Initial Assessment Questions Patient complains of feeling like he's walking on a marble on his right foot. Wife can see a little knot on the bottom of foot. Wife states it has been bothering him for a while but pain got bad last week and he is limping. Tried to schedule patient for 05/18/2024 but due to patients work schedule, unable to book. No available appointments within patients work schedule and availability of visits. UC advised.  1. ONSET: When did the pain start?      Last week 2. LOCATION: Where is the pain located?      Right foot 3. PAIN: How bad is the pain?    (Scale 1-10; or mild, moderate, severe)     6-7 4. WORK OR EXERCISE: Has there been any recent work or exercise that involved this part of the body?      Stands on feet 13 hours a day and wears heavy boots.  Protocols used: Foot Pain-A-AH

## 2024-05-18 NOTE — Telephone Encounter (Signed)
 Noted thanks

## 2024-05-22 ENCOUNTER — Emergency Department (HOSPITAL_BASED_OUTPATIENT_CLINIC_OR_DEPARTMENT_OTHER)
Admission: EM | Admit: 2024-05-22 | Discharge: 2024-05-22 | Disposition: A | Discharge status: Home / Self Care | Attending: Emergency Medicine | Admitting: Emergency Medicine

## 2024-05-22 ENCOUNTER — Encounter (HOSPITAL_BASED_OUTPATIENT_CLINIC_OR_DEPARTMENT_OTHER): Payer: Self-pay

## 2024-05-22 ENCOUNTER — Other Ambulatory Visit: Payer: Self-pay

## 2024-05-22 ENCOUNTER — Emergency Department (HOSPITAL_BASED_OUTPATIENT_CLINIC_OR_DEPARTMENT_OTHER)

## 2024-05-22 DIAGNOSIS — M21611 Bunion of right foot: Secondary | ICD-10-CM

## 2024-05-22 DIAGNOSIS — M79671 Pain in right foot: Secondary | ICD-10-CM

## 2024-05-22 MED ORDER — TRAMADOL HCL 50 MG PO TABS: 50.0000 mg | ORAL_TABLET | Freq: Four times a day (QID) | ORAL | 0 refills | Status: AC | PRN

## 2024-05-22 MED ORDER — ACETAMINOPHEN 500 MG PO TABS
1000.0000 mg | ORAL_TABLET | Freq: Once | ORAL | Status: AC
  Administered 2024-05-22: 1000 mg via ORAL
  Filled 2024-05-22: qty 2

## 2024-05-22 MED ORDER — IBUPROFEN 400 MG PO TABS
400.0000 mg | ORAL_TABLET | Freq: Once | ORAL | Status: AC
  Administered 2024-05-22: 400 mg via ORAL
  Filled 2024-05-22: qty 1

## 2024-05-22 NOTE — ED Provider Notes (Signed)
**Ronald Ronald**  Ronald Ronald   [name]: [id number] Arrival date & time: 05/22/24  9152     Patient presents with: Foot Pain   Ronald Ronald is a 43 y.o. male.   Pt with c/o right foot pain in past few months. Denies specific injury. Dull pain to mid foot, non radiating, seems worse after up on foot and/or after doing a lot of walking.  No redness. No fevers. Denies any specific injury. No fb into foot. Notes remote surgery on left foot for chronic foot pain ?bunion surgery then/Dr [facility].   No numbness/weakness.  The history is provided by the patient.  Foot Pain       Prior to Admission medications   Medication Sig Start Date End Date Taking? Authorizing Provider  ondansetron  (ZOFRAN -ODT) 4 MG disintegrating tablet Dissolve 1 tablet (4 mg total) by mouth every 8 (eight) hours as needed for nausea or vomiting. 04/20/24   Tanda Bleacher, MD  scopolamine  (TRANSDERM-SCOP) 1 MG/3DAYS Place 1 patch (1 mg total) onto the skin every 3 (three) days. 04/20/24   Tanda Bleacher, MD  traMADol  (ULTRAM ) 50 MG tablet Take 1 tablet (50 mg total) by mouth every 6 (six) hours as needed. 05/22/24  Yes Bernard Drivers, MD  aspirin-acetaminophen -caffeine (EXCEDRIN  MIGRAINE) 250-250-65 MG tablet Take 1 tablet by mouth every 6 (six) hours as needed for headache. Take with food 08/27/21   Nche, Roselie Rockford, NP  celecoxib  (CELEBREX ) 200 MG capsule Take 1 capsule (200 mg total) by mouth 2 (two) times daily. Patient not taking: Reported on 03/09/2024 11/05/23   Jerral Meth, MD  clobetasol  cream (TEMOVATE ) 0.05 % Apply twice daily to legs for 4 weeks 05/28/23   Nche, Roselie Rockford, NP    Allergies: Patient has no known allergies.    Review of Systems  Constitutional:  Negative for chills and fever.  Musculoskeletal:        Right foot pain  Skin:  Negative for wound.  Neurological:  Negative for weakness and numbness.    Updated Vital Signs BP (!) 142/88 (BP  Location: Right Arm)   Pulse (!) 59   Temp 98.2 F (36.8 C) (Oral)   Resp 16   Ht 1.905 m (6' 3)   Wt 96.2 kg   SpO2 100%   BMI 26.50 kg/m   Physical Exam Vitals and nursing Ronald reviewed.  Constitutional:      Appearance: Normal appearance. He is well-developed.  HENT:     Head: Atraumatic.  Eyes:     General: No scleral icterus.    Conjunctiva/sclera: Conjunctivae normal.  Neck:     Trachea: No tracheal deviation.  Cardiovascular:     Rate and Rhythm: Normal rate.     Pulses: Normal pulses.  Pulmonary:     Effort: Pulmonary effort is normal. No accessory muscle usage or respiratory distress.  Musculoskeletal:     Comments: Tenderness right mid foot/mid to distal metatarsal area. Bunion right foot. No sign of infection to foot. Foot and toes are of normal color and warmth, w intact pulses, pt/dp and normal cap refill in toes.   Skin:    General: Skin is warm and dry.     Findings: No rash.  Neurological:     Mental Status: He is alert.     Comments: Alert, speech clear. Right foot nvi, w intact motor/sens fxn.   Psychiatric:        Mood and Affect: Mood normal.     (all  labs ordered are listed, but only abnormal results are displayed) Labs Reviewed - No data to display  EKG: None  Radiology: DG Foot Complete Right Result Date: 05/22/2024 EXAM: 3 OR MORE VIEW(S) XRAY OF THE RIGHT FOOT 05/22/2024 10:37:27 AM COMPARISON: 08/05/2018 CLINICAL HISTORY: pain pain FINDINGS: BONES AND JOINTS: Mild hallux valgus deformity of the first metatarsophalangeal joint. Minimal degenerative change is seen involving the first metatarsophalangeal joint. Mild posterior calcaneal spurring is noted. No acute fracture. SOFT TISSUES: The soft tissues are unremarkable. IMPRESSION: 1. Mild hallux valgus deformity of the first metatarsophalangeal joint with minimal degenerative change. 2. Mild posterior calcaneal spurring. Electronically signed by: Lynwood Seip MD 05/22/2024 10:54 AM EST RP  Workstation: [id number]     Procedures   Medications Ordered in the ED - No data to display                                  Medical Decision Making Problems Addressed: Acute foot pain, right: acute illness or injury Bunion, right: chronic illness or injury  Amount and/or Complexity of Data Reviewed Radiology: ordered and independent interpretation performed. Decision-making details documented in ED Course.  Risk Prescription drug management.   Imaging ordered.   Acetaminophen  po, ibuprofen  po. Po fluids.  Xrays reviewed/interpreted by me - no fx.   ?whether chronic foot pain due to bunion, and/or altered walking/altered mechanics - rec foot f/u.  Rx provided.      Final diagnoses:  Acute foot pain, right  Bunion, right    ED Discharge Orders          Ordered    traMADol  (ULTRAM ) 50 MG tablet  Every 6 hours PRN        05/22/24 1121               Bernard Drivers, MD 05/22/24 1121

## 2024-05-22 NOTE — Discharge Instructions (Signed)
 It was our pleasure to provide your ER care today - we hope that you feel better.  Take acetaminophen  or ibuprofen  as need. You may also take ultram  as need for pain - no driving when taking.   Follow up with foot specialist in the next 1-2 weeks - call office tomorrow to arrange appointment.   Return to ER if worse, new symptoms, increased swelling/redness, fevers, or other concern.

## 2024-05-22 NOTE — ED Triage Notes (Signed)
 Pt reports bump on bottom of R foot for several months. Pt unable to see PCP until Feb of next year. Pt reports pain when walking.

## 2024-05-31 ENCOUNTER — Other Ambulatory Visit (HOSPITAL_COMMUNITY): Payer: Self-pay

## 2024-06-03 ENCOUNTER — Ambulatory Visit (HOSPITAL_BASED_OUTPATIENT_CLINIC_OR_DEPARTMENT_OTHER): Attending: Family Medicine | Admitting: Internal Medicine

## 2024-06-03 DIAGNOSIS — G4733 Obstructive sleep apnea (adult) (pediatric): Secondary | ICD-10-CM | POA: Diagnosis not present

## 2024-06-03 DIAGNOSIS — R0683 Snoring: Secondary | ICD-10-CM | POA: Diagnosis present

## 2024-06-11 DIAGNOSIS — G4733 Obstructive sleep apnea (adult) (pediatric): Secondary | ICD-10-CM | POA: Diagnosis not present

## 2024-06-11 NOTE — Procedures (Signed)
" °  Indications for Polysomnography The patient is a 43 year-old Male who is 6' 3 and weighs 212.0 lbs. His BMI equals 26.6.  A full night polysomnogram was performed to evaluate for -.  MedicationNo Data. Polysomnogram Data A full night polysomnogram recorded the standard physiologic parameters including EEG, EOG, EMG, EKG, nasal and oral airflow.  Respiratory parameters of chest and abdominal movements were recorded with Respiratory Inductance Plethysmography belts.   Oxygen saturation was recorded by pulse oximetry.  Sleep Architecture The total recording time of the polysomnogram was 406.1 minutes.  The total sleep time was 360.5 minutes.  The patient spent 8.5% of total sleep time in Stage N1, 69.2% in Stage N2, 5.5% in Stages N3, and 16.8% in REM.  Sleep latency was 20.9 minutes.   REM latency was 158.5 minutes.  Sleep Efficiency was 88.8%.  Wake after Sleep Onset time was 24.5 minutes.  Respiratory Events The polysomnogram revealed a presence of 4 obstructive, 1 central, and - mixed apneas resulting in an Apnea index of 0.8 events per hour.  There were 48 hypopneas (GreaterEqual to3% desaturation and/or arousal) resulting in an Apnea\Hypopnea Index (AHI  GreaterEqual to3% desaturation and/or arousal) of 8.8 events per hour.  There were 19 hypopneas (GreaterEqual to4% desaturation) resulting in an Apnea\Hypopnea Index (AHI GreaterEqual to4% desaturation) of 4.0 events per hour.  There were 10 Respiratory  Effort Related Arousals resulting in a RERA index of 1.7 events per hour. The Respiratory Disturbance Index is 10.5 events per hour.  The snore index was 81.7 events per hour.  Mean oxygen saturation was 96.2%.  The lowest oxygen saturation during sleep was 87.0%.  Time spent LessEqual to88% oxygen saturation was  minutes ().  Limb Activity There were 21 total limb movements recorded, of this total, 9 were classified as PLMs.  PLM index was 1.5 per hour and PLM associated with Arousals  index was - per hour.  Cardiac Summary The average pulse rate was 66.9 bpm.  The minimum pulse rate was 51.0 bpm while the maximum pulse rate was 97.0 bpm.  Cardiac rhythm was normal/abnormal.  Comments:  Diagnosis:  Recommendations:   This study was personally reviewed and electronically signed by: Reggy Salt, MD Accredited Board Certified in Sleep Medicine Date/Time: "

## 2024-06-11 NOTE — Procedures (Signed)
 "       [facility] [address] [place], [place] [address] Tel: [phone]   Fax: [phone]  Polysomnography Interpretation  Patient Name:  Ronald Jackson, Ronald Jackson Study Date:  06/03/2024 Referring Physician:  RAGUEL BLUSH [phone]) %%startinterp%% Indications for Polysomnography The patient is a 43 year-old Male who is 6' 3 and weighs 212.0 lbs. His BMI equals 26.6.  A full night polysomnogram was performed to evaluate for OSA.  Medication  No Data.   Polysomnogram Data A full night polysomnogram recorded the standard physiologic parameters including EEG, EOG, EMG, EKG, nasal and oral airflow.  Respiratory parameters of chest and abdominal movements were recorded with Respiratory Inductance Plethysmography belts.  Oxygen saturation was recorded by pulse oximetry.   Sleep Architecture The total recording time of the polysomnogram was 406.1 minutes.  The total sleep time was 360.5 minutes.  The patient spent 8.5% of total sleep time in Stage N1, 69.2% in Stage N2, 5.5% in Stages N3, and 16.8% in REM.  Sleep latency was 20.9 minutes.  REM latency was 158.5 minutes.  Sleep Efficiency was 88.8%.  Wake after Sleep Onset time was 24.5 minutes.  Respiratory Events The polysomnogram revealed a presence of 4 obstructive, 1 central, and - mixed apneas resulting in an Apnea index of 0.8 events per hour.  There were 48 hypopneas (>=3% desaturation and/or arousal) resulting in an Apnea\Hypopnea Index (AHI >=3% desaturation and/or arousal) of 8.8 events per hour.  There were 19 hypopneas (>=4% desaturation) resulting in an Apnea\Hypopnea Index (AHI >=4% desaturation) of 4.0 events per hour.  There were 10 Respiratory Effort Related Arousals resulting in a RERA index of 1.7 events per hour. The Respiratory Disturbance Index is 10.5 events per hour.  The snore index was 81.7 events per hour.  Mean oxygen saturation was 96.2%.  The lowest oxygen saturation during sleep  was 87.0%.  Time spent <=88% oxygen saturation was 0.1 minutes (-).  Limb Activity There were 21 total limb movements recorded, of this total, 9 were classified as PLMs.  PLM index was 1.5 per hour and PLM associated with Arousals index was - per hour.  Cardiac Summary The average pulse rate was 66.9 bpm.  The minimum pulse rate was 51.0 bpm while the maximum pulse rate was 97.0 bpm.  Cardiac rhythm was normal.  Comments: Mild obstructive sleep apnea, AHI(3%) 8.8/hr. Moderate to loud snoring with oxygen desaturation to a nadir of 87%, mean 96.2%.  Diagnosis: Obstructive sleep apnea  Recommendations: Conservative measures might include observation, weight loss and sleep position off back. Other options, including autopap ( 5-20), CPAP titration sleep study or fitted oral appliance, would be based on clinical judgment.   This study was personally reviewed and electronically signed by: Reggy Salt, MD Accredited Board Certified in Sleep Medicine Date/Time: 06/11/24  12:29    %%endinterp%%   Diagnostic PSG Report  Patient Name: Ronald, Jackson Study Date: 06/03/2024  Date of Birth: Jul 06, 1980 Study Type: Diagnostic  Age: 80 year MRN #: [id number]  Sex: Male Interpreting Physician: SALT REGGY, [id number]  Height: 6' 3 Referring Physician: RAGUEL BLUSH [phone])  Weight: 212.0 lbs Recording Tech: Holly Neeriemer RPSGT RST  BMI: 26.6 Scoring Tech: Holly Neeriemer RPSGT RST  ESS: 13 Neck Size: 17.25   Study Overview  Lights Off: 09:57:20 PM  Count Index  Lights On: 04:43:27 AM Awakenings: 24 4.0  Time in Bed: 406.1 min. Arousals: 123 20.5  Total Sleep Time: 360.5 min. AHI (>=3% Desat and/or Ar.):  53 8.8   Sleep Efficiency: 88.8% AHI (>=4% Desat): 24 4.0   Sleep Latency: 20.9 min. Limb Movements: 21 3.5  Wake After Sleep Onset: 24.5 min. Snore: 491 81.7  REM Latency from Sleep Onset: 158.5 min. Desaturations: 62 10.3     Minimum SpO2 TST: 87.0%    Sleep Architecture  % of Time in  Bed Stages Time (mins) % Sleep Time  Wake 46.0   Stage N1 30.5 8.5%  Stage N2 249.5 69.2%  Stage N3 20.0 5.5%  REM 60.5 16.8%   Arousal Summary   NREM REM Sleep Index  Respiratory Arousals 29 3 32 5.3  PLM Arousals - - - -  Isolated Limb Movement Arousals 1 - 1 0.2  Snore Arousals 26 5 31  5.2  Spontaneous Arousals 42 17 59 9.8  Total 98 25 123 20.5   Limb Movement Summary   Count Index  Isolated Limb Movements 12 2.0  Periodic Limb Movements (PLMs) 9 1.5  Total Limb Movements 21 3.5    Respiratory Summary   By Sleep Stage By Body Position Total   NREM REM Supine Non-Supine   Time (min) 300.0 60.5 90.0 270.5 360.5         Obstructive Apnea 4 - 4 - 4  Mixed Apnea - - - - -  Central Apnea 1 - - 1 1  Total Apneas 5 - 4 1 5   Total Apnea Index 1.0 - 2.7 0.2 0.8         Hypopneas (>=3% Desat and/or Ar.) 38 10 24 24  48  AHI (>=3% Desat and/or Ar.) 8.6 9.9 18.7 5.5 8.8         Hypopneas (>=4% Desat) 15 4 10 9 19   AHI (>=4% Desat) 4.0 4.0 9.3 2.2 4.0          RERAs 8 2 - 10 10  RERA Index 1.6 2.0 - 2.2 1.7         RDI 10.2 11.9 18.7 7.8 10.5    Respiratory Event Type Index  Central Apneas 0.2  Obstructive Apneas 0.7  Mixed Apneas -  Central Hypopneas -  Obstructive Hypopneas 8.0  Central Apnea + Hypopnea (CAHI) 0.2  Obstructive Apnea + Hypopnea (OAHI) 8.8   Respiratory Event Durations   Apnea Hypopnea   NREM REM NREM REM  Average (seconds) 12.4 - 16.1 18.6  Maximum (seconds) 17.1 - 30.4 27.4    Oxygen Saturation Summary   Wake NREM REM TST TIB  Average SpO2 (%) 96.9% 96.0% 96.3% 96.1% 96.2%  Minimum SpO2 (%) 89.0% 87.0% 92.0% 87.0% 87.0%  Maximum SpO2 (%) 99.0% 99.0% 98.0% 99.0% 99.0%   Oxygen Saturation Distribution  Range (%) Time in range (min) Time in range (%)  90.0 - 100.0 405.1 99.9%  80.0 - 90.0 0.5 0.1%  70.0 - 80.0 - -  60.0 - 70.0 - -  50.0 - 60.0 - -  0.0 - 50.0 - -  Time Spent <=88% SpO2  Range (%) Time in range (min) Time in  range (%)  0.0 - 88.0 0.1 0.0%      Count Index  Desaturations 62 10.3    Cardiac Summary   Wake NREM REM Sleep Total  Average Pulse Rate (BPM) 71.0 65.1 72.7 66.4 66.9  Minimum Pulse Rate (BPM) 55.0 55.0 51.0 51.0 51.0  Maximum Pulse Rate (BPM) 97.0 90.0 95.0 95.0 97.0   Pulse Rate Distribution:  Range (bpm) Time in range (min) Time in range (%)  0.0 - 40.0 - -  40.0 - 60.0 44.7 11.0%  60.0 - 80.0 351.7 86.7%  80.0 - 100.0 9.3 2.3%  100.0 - 120.0 - -  120.0 - 140.0 - -  140.0 - 200.0 - -      Hypnograms                      Technologist Comments  The 43 y/o male was seen in the [facility] for a Diagnostic PSG. His associated dx is snoring.  The study was performed in sleep room #3.   The patient arrived and was explained the procedure. The patient was observed to snore loud to moderate, and intermittently. Apneas and hypopneas were observed. The patient slept supine and L & R lateral. He was noted to put his hands behind his head while supine and also place his hand under his head in both lateral positions.   No supplemental O2 was applied No restroom visits were taken during the recording No medications were taken in the [facility]  ECG appeared to be Sinus Rhythm PLMs PLMAs were occasional at sleep onset No parasomnias were observed  *There was loud banging and popping sounds in the wall and ceiling throughout the lab from 8pm through the night past 0700.  The patient was disturbed by this noise during the study.                         Reggy Salt Diplomate, Biomedical Engineer of Sleep Medicine  ELECTRONICALLY SIGNED ON:  06/11/2024, 12:07 PM [facility] PH: [phone]   FX: [phone] ACCREDITED BY THE AMERICAN ACADEMY OF SLEEP MEDICINE "

## 2024-06-13 ENCOUNTER — Ambulatory Visit: Payer: Self-pay | Admitting: Family

## 2024-06-21 ENCOUNTER — Encounter: Admitting: Family Medicine

## 2024-06-30 ENCOUNTER — Other Ambulatory Visit (HOSPITAL_COMMUNITY): Payer: Self-pay

## 2024-07-01 ENCOUNTER — Other Ambulatory Visit (HOSPITAL_COMMUNITY): Payer: Self-pay
